# Patient Record
Sex: Female | Born: 1975 | Race: White | Hispanic: No | Marital: Married | Smoking: Never smoker
Health system: Southern US, Community
[De-identification: ages and names within clinical notes are randomized; demographics above are authoritative.]

## PROBLEM LIST (undated history)

## (undated) ENCOUNTER — Inpatient Hospital Stay (HOSPITAL_COMMUNITY): Payer: Self-pay

## (undated) DIAGNOSIS — Z9889 Other specified postprocedural states: Secondary | ICD-10-CM

## (undated) DIAGNOSIS — R112 Nausea with vomiting, unspecified: Secondary | ICD-10-CM

## (undated) DIAGNOSIS — O24419 Gestational diabetes mellitus in pregnancy, unspecified control: Secondary | ICD-10-CM

## (undated) DIAGNOSIS — Z789 Other specified health status: Secondary | ICD-10-CM

## (undated) DIAGNOSIS — Z923 Personal history of irradiation: Secondary | ICD-10-CM

## (undated) DIAGNOSIS — C801 Malignant (primary) neoplasm, unspecified: Secondary | ICD-10-CM

## (undated) HISTORY — PX: DIAGNOSTIC LAPAROSCOPY: SUR761

## (undated) HISTORY — PX: ABDOMINAL HYSTERECTOMY: SHX81

## (undated) HISTORY — PX: OTHER SURGICAL HISTORY: SHX169

---

## 2004-03-17 ENCOUNTER — Emergency Department (HOSPITAL_COMMUNITY): Admission: EM | Admit: 2004-03-17 | Discharge: 2004-03-17 | Payer: Self-pay | Admitting: Emergency Medicine

## 2004-03-18 ENCOUNTER — Ambulatory Visit (HOSPITAL_COMMUNITY): Admission: RE | Admit: 2004-03-18 | Discharge: 2004-03-18 | Payer: Self-pay | Admitting: Emergency Medicine

## 2004-08-15 ENCOUNTER — Ambulatory Visit: Payer: Self-pay | Admitting: Internal Medicine

## 2004-08-19 ENCOUNTER — Ambulatory Visit: Payer: Self-pay | Admitting: Internal Medicine

## 2007-07-12 ENCOUNTER — Encounter: Admission: RE | Admit: 2007-07-12 | Discharge: 2007-07-12 | Payer: Self-pay | Admitting: Family Medicine

## 2007-07-30 ENCOUNTER — Ambulatory Visit (HOSPITAL_COMMUNITY): Admission: RE | Admit: 2007-07-30 | Discharge: 2007-07-30 | Payer: Self-pay | Admitting: Obstetrics and Gynecology

## 2008-07-05 ENCOUNTER — Inpatient Hospital Stay (HOSPITAL_COMMUNITY): Admission: AD | Admit: 2008-07-05 | Discharge: 2008-07-06 | Payer: Self-pay | Admitting: Obstetrics and Gynecology

## 2008-08-24 ENCOUNTER — Inpatient Hospital Stay (HOSPITAL_COMMUNITY): Admission: AD | Admit: 2008-08-24 | Discharge: 2008-08-24 | Payer: Self-pay | Admitting: Obstetrics and Gynecology

## 2008-09-11 ENCOUNTER — Inpatient Hospital Stay (HOSPITAL_COMMUNITY): Admission: AD | Admit: 2008-09-11 | Discharge: 2008-09-14 | Payer: Self-pay | Admitting: Obstetrics & Gynecology

## 2010-04-06 ENCOUNTER — Encounter
Admission: RE | Admit: 2010-04-06 | Discharge: 2010-04-06 | Payer: Self-pay | Source: Home / Self Care | Attending: Obstetrics and Gynecology | Admitting: Obstetrics and Gynecology

## 2010-05-03 ENCOUNTER — Inpatient Hospital Stay (HOSPITAL_COMMUNITY)
Admission: AD | Admit: 2010-05-03 | Discharge: 2010-05-03 | Payer: Self-pay | Source: Home / Self Care | Attending: Obstetrics and Gynecology | Admitting: Obstetrics and Gynecology

## 2010-05-12 ENCOUNTER — Inpatient Hospital Stay (HOSPITAL_COMMUNITY)
Admission: AD | Admit: 2010-05-12 | Discharge: 2010-05-14 | Payer: Self-pay | Source: Home / Self Care | Attending: Obstetrics and Gynecology | Admitting: Obstetrics and Gynecology

## 2010-07-25 LAB — CBC
HCT: 35.6 % — ABNORMAL LOW (ref 36.0–46.0)
HCT: 36.2 % (ref 36.0–46.0)
HCT: 37 % (ref 36.0–46.0)
Hemoglobin: 12.3 g/dL (ref 12.0–15.0)
Hemoglobin: 12.4 g/dL (ref 12.0–15.0)
Hemoglobin: 13.8 g/dL (ref 12.0–15.0)
MCH: 29.8 pg (ref 26.0–34.0)
MCHC: 34.3 g/dL (ref 30.0–36.0)
MCHC: 34.6 g/dL (ref 30.0–36.0)
MCHC: 35.4 g/dL (ref 30.0–36.0)
Platelets: 187 10*3/uL (ref 150–400)
Platelets: 191 10*3/uL (ref 150–400)
Platelets: 215 10*3/uL (ref 150–400)
RBC: 4.13 MIL/uL (ref 3.87–5.11)
RDW: 13.3 % (ref 11.5–15.5)
RDW: 13.5 % (ref 11.5–15.5)
RDW: 13.6 % (ref 11.5–15.5)
WBC: 9.8 10*3/uL (ref 4.0–10.5)

## 2010-07-25 LAB — RPR: RPR Ser Ql: NONREACTIVE

## 2010-08-23 LAB — CBC: Platelets: 192 10*3/uL (ref 150–400)

## 2010-08-24 LAB — CBC
HCT: 37.9 % (ref 36.0–46.0)
MCHC: 35.7 g/dL (ref 30.0–36.0)
MCV: 89.7 fL (ref 78.0–100.0)
RBC: 4.22 MIL/uL (ref 3.87–5.11)
WBC: 11.1 10*3/uL — ABNORMAL HIGH (ref 4.0–10.5)

## 2010-08-30 LAB — URINE CULTURE: Colony Count: >=100000

## 2010-08-30 LAB — URINALYSIS, ROUTINE W REFLEX MICROSCOPIC
Ketones, ur: 40 mg/dL — AB
Protein, ur: NEGATIVE mg/dL
Specific Gravity, Urine: 1.025 (ref 1.005–1.030)
pH: 6 (ref 5.0–8.0)

## 2010-08-30 LAB — URINE MICROSCOPIC-ADD ON

## 2010-12-15 LAB — STREP B DNA PROBE: GBS: POSITIVE

## 2010-12-15 LAB — ANTIBODY SCREEN: Antibody Screen: NEGATIVE

## 2010-12-15 LAB — GC/CHLAMYDIA PROBE AMP, GENITAL
Chlamydia: NEGATIVE
Gonorrhea: NEGATIVE

## 2010-12-15 LAB — ABO/RH: RH Type: POSITIVE

## 2011-05-16 NOTE — L&D Delivery Note (Signed)
Delivery Note At 2:46 AM a viable female was delivered via Vaginal, Spontaneous Delivery (Presentation: ;  ).  APGAR:8/9 , ; weight .   Placenta status:intact , .  Cord: 3 vessels with the following complications: .    Anesthesia: Epidural  Episiotomy: None Lacerations: none Suture Repair: na Est. Blood Loss (mL): 400 cc  Mom to postpartum.  Baby to nursery-stable.  Victoria Cunningham 06/25/2011, 2:53 AM

## 2011-05-20 ENCOUNTER — Encounter (HOSPITAL_COMMUNITY): Payer: Self-pay | Admitting: *Deleted

## 2011-05-20 ENCOUNTER — Inpatient Hospital Stay (HOSPITAL_COMMUNITY)
Admission: AD | Admit: 2011-05-20 | Discharge: 2011-05-20 | Disposition: A | Discharge status: Home / Self Care | Payer: 59 | Source: Ambulatory Visit | Attending: Obstetrics and Gynecology | Admitting: Obstetrics and Gynecology

## 2011-05-20 DIAGNOSIS — M545 Low back pain, unspecified: Secondary | ICD-10-CM | POA: Insufficient documentation

## 2011-05-20 DIAGNOSIS — O479 False labor, unspecified: Secondary | ICD-10-CM

## 2011-05-20 DIAGNOSIS — O99891 Other specified diseases and conditions complicating pregnancy: Secondary | ICD-10-CM | POA: Insufficient documentation

## 2011-05-20 DIAGNOSIS — Z01419 Encounter for gynecological examination (general) (routine) without abnormal findings: Secondary | ICD-10-CM

## 2011-05-20 HISTORY — DX: Other specified health status: Z78.9

## 2011-05-20 LAB — URINALYSIS, ROUTINE W REFLEX MICROSCOPIC
Bilirubin Urine: NEGATIVE
Leukocytes, UA: NEGATIVE
Nitrite: NEGATIVE
Specific Gravity, Urine: >1.03 — ABNORMAL HIGH (ref 1.005–1.030)

## 2011-05-20 MED ORDER — ONDANSETRON 4 MG PO TBDP: 4.0000 mg | ORAL_TABLET | Freq: Four times a day (QID) | ORAL | Status: AC | PRN

## 2011-05-20 NOTE — ED Provider Notes (Signed)
Chief Complaint:  Emesis and Rupture of Membranes   Victoria Cunningham is  36 y.o. G3P2002 at [redacted]w[redacted]d presents complaining of Emesis and Rupture of Membranes .  She states she is not having contractions associated with none vaginal bleeding, possibly ruptured membranes, along with active fetal movement. She had fluid come out with coughing earlier today  Obstetrical/Gynecological History: Menstrual History: OB History    Grav Para Term Preterm Abortions TAB SAB Ect Mult Living   3 2 2  0 0 0 0 0 0 2      [place] No LMP recorded. Patient is pregnant.     Past Medical History: Past Medical History  Diagnosis Date  . No pertinent past medical history     Past Surgical History: Past Surgical History  Procedure Date  . Lapriscopic     Family History: No family history on file.  Social History: History  Substance Use Topics  . Smoking status: Never Smoker   . Smokeless tobacco: Not on file  . Alcohol Use: No    Allergies: No Known Allergies  Meds:  Prescriptions prior to admission  Medication Sig Dispense Refill  . calcium carbonate (TUMS - DOSED IN MG ELEMENTAL CALCIUM) 500 MG chewable tablet Chew 3-4 tablets by mouth daily. For indigestion       . chlorpheniramine (CHLOR-TRIMETON) 4 MG tablet Take 4 mg by mouth every 4 (four) hours.        . Prenatal Vit-Fe Fumarate-FA (PRENATAL MULTIVITAMIN) TABS Take 1 tablet by mouth daily.          Review of Systems - Please refer to the aforementioned patients' reports.     Physical Exam  Blood pressure 125/66, pulse 111, temperature 98.4 F (36.9 C), temperature source Oral, resp. rate 18, height 5\' 3"  (1.6 m), weight 94.802 kg (209 lb). GENERAL: Well-developed, well-nourished female in no acute distress.  LUNGS: Clear to auscultation bilaterally.  HEART: Regular rate and rhythm. ABDOMEN: Soft, nontender, nondistended, gravid.  EXTREMITIES: Nontender, no edema, 2+ distal pulses. Cervical Exam: Dilatation 0cm   Effacement 0%    Station -3   Presentation: cephalic SSE:  No pooling, negative fern and valsalva, normal appearing discharge FHT:  Baseline rate 140 bpm   Variability moderate  Accelerations present   Decelerations none Contractions: Every 0 mins   Labs: Recent Results (from the past 24 hour(s))  URINALYSIS, ROUTINE W REFLEX MICROSCOPIC   Collection Time   05/20/11  5:20 PM      Component Value Range   Color, Urine YELLOW  YELLOW    APPearance CLEAR  CLEAR    Specific Gravity, Urine >1.030 (*) 1.005 - 1.030    pH 6.0  5.0 - 8.0    Glucose, UA NEGATIVE  NEGATIVE (mg/dL)   Hgb urine dipstick NEGATIVE  NEGATIVE    Bilirubin Urine NEGATIVE  NEGATIVE    Ketones, ur 15 (*) NEGATIVE (mg/dL)   Protein, ur NEGATIVE  NEGATIVE (mg/dL)   Urobilinogen, UA 1.0  0.0 - 1.0 (mg/dL)   Nitrite NEGATIVE  NEGATIVE    Leukocytes, UA NEGATIVE  NEGATIVE    Imaging Studies:  No results found.  Assessment: Victoria Cunningham is  36 y.o. G3P2002 at [redacted]w[redacted]d presents with probable urinary incontinence   Plan: Dr. Rana Snare notified; Rx for Zofran  CRESENZO-DISHMAN,Zigmund Linse 1/5/20136:06 PM

## 2011-05-20 NOTE — Progress Notes (Signed)
Pt reports cough this morning and a large amount of liquid came out. Pt reports having vomiting x 5 today.

## 2011-06-24 ENCOUNTER — Encounter (HOSPITAL_COMMUNITY): Payer: Self-pay

## 2011-06-24 ENCOUNTER — Inpatient Hospital Stay (HOSPITAL_COMMUNITY)
Admission: AD | Admit: 2011-06-24 | Discharge: 2011-06-26 | DRG: 775 | Disposition: A | Discharge status: Home / Self Care | Payer: 59 | Source: Ambulatory Visit | Attending: Obstetrics and Gynecology | Admitting: Obstetrics and Gynecology

## 2011-06-24 ENCOUNTER — Encounter (HOSPITAL_COMMUNITY): Payer: Self-pay | Admitting: Anesthesiology

## 2011-06-24 ENCOUNTER — Inpatient Hospital Stay (HOSPITAL_COMMUNITY): Payer: 59 | Admitting: Anesthesiology

## 2011-06-24 DIAGNOSIS — O09529 Supervision of elderly multigravida, unspecified trimester: Principal | ICD-10-CM | POA: Diagnosis present

## 2011-06-24 DIAGNOSIS — Z22330 Carrier of Group B streptococcus: Secondary | ICD-10-CM

## 2011-06-24 DIAGNOSIS — O99892 Other specified diseases and conditions complicating childbirth: Secondary | ICD-10-CM | POA: Diagnosis present

## 2011-06-24 HISTORY — DX: Other specified postprocedural states: Z98.890

## 2011-06-24 HISTORY — DX: Other specified postprocedural states: R11.2

## 2011-06-24 LAB — CBC
HCT: 35.6 % — ABNORMAL LOW (ref 36.0–46.0)
Hemoglobin: 12.2 g/dL (ref 12.0–15.0)
MCHC: 34.3 g/dL (ref 30.0–36.0)
MCV: 87.3 fL (ref 78.0–100.0)
RDW: 13.2 % (ref 11.5–15.5)

## 2011-06-24 MED ORDER — OXYCODONE-ACETAMINOPHEN 5-325 MG PO TABS: 1.0000 | ORAL_TABLET | ORAL | Status: DC | PRN

## 2011-06-24 MED ORDER — PENICILLIN G POTASSIUM 5000000 UNITS IJ SOLR
5.0000 10*6.[IU] | Freq: Once | INTRAVENOUS | Status: AC
  Administered 2011-06-24: 5 10*6.[IU] via INTRAVENOUS
  Filled 2011-06-24: qty 5

## 2011-06-24 MED ORDER — LACTATED RINGERS IV SOLN
500.0000 mL | Freq: Once | INTRAVENOUS | Status: AC
  Administered 2011-06-24: 500 mL via INTRAVENOUS

## 2011-06-24 MED ORDER — PENICILLIN G POTASSIUM 5000000 UNITS IJ SOLR
2.5000 10*6.[IU] | INTRAVENOUS | Status: DC
  Administered 2011-06-25: 2.5 10*6.[IU] via INTRAVENOUS
  Filled 2011-06-24 (×4): qty 2.5

## 2011-06-24 MED ORDER — IBUPROFEN 600 MG PO TABS: 600.0000 mg | ORAL_TABLET | Freq: Four times a day (QID) | ORAL | Status: DC | PRN

## 2011-06-24 MED ORDER — PHENYLEPHRINE 40 MCG/ML (10ML) SYRINGE FOR IV PUSH (FOR BLOOD PRESSURE SUPPORT): 80.0000 ug | PREFILLED_SYRINGE | INTRAVENOUS | Status: DC | PRN

## 2011-06-24 MED ORDER — EPHEDRINE 5 MG/ML INJ
10.0000 mg | INTRAVENOUS | Status: DC | PRN
  Filled 2011-06-24: qty 4

## 2011-06-24 MED ORDER — FLEET ENEMA 7-19 GM/118ML RE ENEM: 1.0000 | ENEMA | RECTAL | Status: DC | PRN

## 2011-06-24 MED ORDER — ONDANSETRON HCL 4 MG/2ML IJ SOLN
4.0000 mg | Freq: Four times a day (QID) | INTRAMUSCULAR | Status: DC | PRN
  Administered 2011-06-25: 4 mg via INTRAVENOUS
  Filled 2011-06-24: qty 2

## 2011-06-24 MED ORDER — CITRIC ACID-SODIUM CITRATE 334-500 MG/5ML PO SOLN
30.0000 mL | ORAL | Status: DC | PRN
  Administered 2011-06-24: 30 mL via ORAL
  Filled 2011-06-24 (×2): qty 15

## 2011-06-24 MED ORDER — ACETAMINOPHEN 325 MG PO TABS: 650.0000 mg | ORAL_TABLET | ORAL | Status: DC | PRN

## 2011-06-24 MED ORDER — OXYTOCIN BOLUS FROM INFUSION
500.0000 mL | Freq: Once | INTRAVENOUS | Status: DC
  Filled 2011-06-24: qty 1000
  Filled 2011-06-24: qty 500

## 2011-06-24 MED ORDER — DIPHENHYDRAMINE HCL 50 MG/ML IJ SOLN: 12.5000 mg | INTRAMUSCULAR | Status: DC | PRN

## 2011-06-24 MED ORDER — OXYTOCIN 20 UNITS IN LACTATED RINGERS INFUSION - SIMPLE
125.0000 mL/h | Freq: Once | INTRAVENOUS | Status: AC
  Administered 2011-06-25: 125 mL/h via INTRAVENOUS

## 2011-06-24 MED ORDER — EPHEDRINE 5 MG/ML INJ: 10.0000 mg | INTRAVENOUS | Status: DC | PRN

## 2011-06-24 MED ORDER — LACTATED RINGERS IV SOLN: INTRAVENOUS | Status: DC

## 2011-06-24 MED ORDER — PHENYLEPHRINE 40 MCG/ML (10ML) SYRINGE FOR IV PUSH (FOR BLOOD PRESSURE SUPPORT)
80.0000 ug | PREFILLED_SYRINGE | INTRAVENOUS | Status: DC | PRN
  Filled 2011-06-24: qty 5

## 2011-06-24 MED ORDER — LIDOCAINE HCL (PF) 1 % IJ SOLN
INTRAMUSCULAR | Status: DC | PRN
  Administered 2011-06-24 (×3): 4 mL

## 2011-06-24 MED ORDER — FENTANYL 2.5 MCG/ML BUPIVACAINE 1/10 % EPIDURAL INFUSION (WH - ANES)
14.0000 mL/h | INTRAMUSCULAR | Status: DC
  Administered 2011-06-24 – 2011-06-25 (×2): 14 mL/h via EPIDURAL
  Filled 2011-06-24 (×2): qty 60

## 2011-06-24 MED ORDER — LACTATED RINGERS IV SOLN: 500.0000 mL | INTRAVENOUS | Status: DC | PRN

## 2011-06-24 MED ORDER — LIDOCAINE HCL (PF) 1 % IJ SOLN
30.0000 mL | INTRAMUSCULAR | Status: DC | PRN
  Filled 2011-06-24: qty 30

## 2011-06-24 NOTE — Progress Notes (Signed)
Pt states ctx's since 12pm this afternoon, denies lof, notes bloody show. +Fm, was 4cm in office.

## 2011-06-24 NOTE — ED Notes (Signed)
Dr. Arelia Sneddon notified pt's cervical exam post ambulation, now 5.5cm, can stretch to 6, still 80%, cervix more anterior, membranes bulging. Ctx's q1-4.5. GBS positive, orders to admit to [facility], PCN protocol.

## 2011-06-24 NOTE — ED Notes (Signed)
Dr. Arelia Sneddon notified of pt in MAU for labor eval, G3P2, efm tracing reactive, cervix was 4cm, now 5, no change in effacement, ctx's q3-5 minutes apart, order to po hydrate and ambulate x1hour, then recheck and call MD with results.

## 2011-06-24 NOTE — Anesthesia Procedure Notes (Signed)
Epidural Patient location during procedure: OB Start time: 06/24/2011 10:54 PM Reason for block: procedure for pain  Staffing Performed by: anesthesiologist   Preanesthetic Checklist Completed: patient identified, site marked, surgical consent, pre-op evaluation, timeout performed, IV checked, risks and benefits discussed and monitors and equipment checked  Epidural Patient position: sitting Prep: site prepped and draped and DuraPrep Patient monitoring: continuous pulse ox and blood pressure Approach: midline Injection technique: LOR air  Needle:  Needle type: Tuohy  Needle gauge: 17 G Needle length: 9 cm Catheter type: closed end flexible Catheter size: 19 Gauge Test dose: negative  Assessment Events: blood not aspirated, injection not painful, no injection resistance, negative IV test and no paresthesia  Additional Notes Discussed risk of headache, infection, bleeding, nerve injury and failed or incomplete block.  Patient voices understanding and wishes to proceed.

## 2011-06-24 NOTE — Anesthesia Preprocedure Evaluation (Signed)
Anesthesia Evaluation  Patient identified by MRN, date of birth, ID band Patient awake    Reviewed: Allergy & Precautions, H&P , NPO status , Patient's Chart, lab work & pertinent test results, reviewed documented beta blocker date and time   History of Anesthesia Complications (+) PONV  Airway Mallampati: II TM Distance: >3 FB Neck ROM: full    Dental  (+) Teeth Intact   Pulmonary neg pulmonary ROS,  clear to auscultation        Cardiovascular neg cardio ROS regular Normal    Neuro/Psych Negative Neurological ROS  Negative Psych ROS   GI/Hepatic Neg liver ROS, GERD- (with pregnancy)  Medicated,  Endo/Other  Morbid obesity  Renal/GU negative Renal ROS  Genitourinary negative   Musculoskeletal   Abdominal   Peds  Hematology negative hematology ROS (+)   Anesthesia Other Findings   Reproductive/Obstetrics (+) Pregnancy                           Anesthesia Physical Anesthesia Plan  ASA: II  Anesthesia Plan: Epidural   Post-op Pain Management:    Induction:   Airway Management Planned:   Additional Equipment:   Intra-op Plan:   Post-operative Plan:   Informed Consent: I have reviewed the patients History and Physical, chart, labs and discussed the procedure including the risks, benefits and alternatives for the proposed anesthesia with the patient or authorized representative who has indicated his/her understanding and acceptance.     Plan Discussed with:   Anesthesia Plan Comments:         Anesthesia Quick Evaluation

## 2011-06-25 ENCOUNTER — Encounter (HOSPITAL_COMMUNITY): Payer: Self-pay | Admitting: *Deleted

## 2011-06-25 MED ORDER — BENZOCAINE-MENTHOL 20-0.5 % EX AERO: 1.0000 "application " | INHALATION_SPRAY | CUTANEOUS | Status: DC | PRN

## 2011-06-25 MED ORDER — FLEET ENEMA 7-19 GM/118ML RE ENEM: 1.0000 | ENEMA | Freq: Every day | RECTAL | Status: DC | PRN

## 2011-06-25 MED ORDER — SIMETHICONE 80 MG PO CHEW: 80.0000 mg | CHEWABLE_TABLET | ORAL | Status: DC | PRN

## 2011-06-25 MED ORDER — PRENATAL MULTIVITAMIN CH: 1.0000 | ORAL_TABLET | Freq: Every day | ORAL | Status: DC

## 2011-06-25 MED ORDER — SENNOSIDES-DOCUSATE SODIUM 8.6-50 MG PO TABS
2.0000 | ORAL_TABLET | Freq: Every day | ORAL | Status: DC
  Administered 2011-06-25: 2 via ORAL

## 2011-06-25 MED ORDER — ONDANSETRON HCL 4 MG/2ML IJ SOLN: 4.0000 mg | INTRAMUSCULAR | Status: DC | PRN

## 2011-06-25 MED ORDER — LANOLIN HYDROUS EX OINT: TOPICAL_OINTMENT | CUTANEOUS | Status: DC | PRN

## 2011-06-25 MED ORDER — DIBUCAINE 1 % RE OINT: 1.0000 "application " | TOPICAL_OINTMENT | RECTAL | Status: DC | PRN

## 2011-06-25 MED ORDER — WITCH HAZEL-GLYCERIN EX PADS: 1.0000 "application " | MEDICATED_PAD | CUTANEOUS | Status: DC | PRN

## 2011-06-25 MED ORDER — ZOLPIDEM TARTRATE 5 MG PO TABS: 5.0000 mg | ORAL_TABLET | Freq: Every evening | ORAL | Status: DC | PRN

## 2011-06-25 MED ORDER — DIPHENHYDRAMINE HCL 25 MG PO CAPS: 25.0000 mg | ORAL_CAPSULE | Freq: Four times a day (QID) | ORAL | Status: DC | PRN

## 2011-06-25 MED ORDER — BISACODYL 10 MG RE SUPP: 10.0000 mg | Freq: Every day | RECTAL | Status: DC | PRN

## 2011-06-25 MED ORDER — OXYCODONE-ACETAMINOPHEN 5-325 MG PO TABS
1.0000 | ORAL_TABLET | ORAL | Status: DC | PRN
  Administered 2011-06-25: 2 via ORAL
  Administered 2011-06-26: 1 via ORAL
  Filled 2011-06-25: qty 1
  Filled 2011-06-25: qty 2

## 2011-06-25 MED ORDER — ONDANSETRON HCL 4 MG PO TABS: 4.0000 mg | ORAL_TABLET | ORAL | Status: DC | PRN

## 2011-06-25 MED ORDER — PRENATAL MULTIVITAMIN CH
1.0000 | ORAL_TABLET | Freq: Every day | ORAL | Status: DC
  Administered 2011-06-25: 1 via ORAL
  Filled 2011-06-25: qty 1

## 2011-06-25 MED ORDER — TETANUS-DIPHTH-ACELL PERTUSSIS 5-2.5-18.5 LF-MCG/0.5 IM SUSP: 0.5000 mL | Freq: Once | INTRAMUSCULAR | Status: DC

## 2011-06-25 MED ORDER — IBUPROFEN 600 MG PO TABS
600.0000 mg | ORAL_TABLET | Freq: Four times a day (QID) | ORAL | Status: DC
  Administered 2011-06-25 – 2011-06-26 (×5): 600 mg via ORAL
  Filled 2011-06-25 (×6): qty 1

## 2011-06-25 NOTE — Progress Notes (Signed)
Post Partum Day zero Subjective: no complaints  Objective: Blood pressure 104/68, pulse 76, temperature 97.4 F (36.3 C), temperature source Oral, resp. rate 20, height 5\' 3"  (1.6 m), weight 96.843 kg (213 lb 8 oz), SpO2 98.00%, unknown if currently breastfeeding.  Physical Exam:  General: alert Lochia: appropriate Uterine Fundus: firm Incision: none DVT Evaluation: No evidence of DVT seen on physical exam.   Basename 06/24/11 2201  HGB 12.2  HCT 35.6*    Assessment/Plan: Plan for discharge tomorrow   LOS: 1 day   Tayelor Osborne S 06/25/2011, 10:23 AM

## 2011-06-25 NOTE — Progress Notes (Signed)
Transferred via wheelchair, infant and husband with pt

## 2011-06-25 NOTE — Anesthesia Postprocedure Evaluation (Signed)
  Anesthesia Post-op Note  Patient: Victoria Cunningham  Procedure(s) Performed: * No procedures listed *  Patient Location: Mother/Baby  Anesthesia Type: Epidural  Level of Consciousness: awake, alert  and oriented  Airway and Oxygen Therapy: Patient Spontanous Breathing  Post-op Pain: mild  Post-op Assessment: Patient's Cardiovascular Status Stable, Respiratory Function Stable, Patent Airway, No signs of Nausea or vomiting and Pain level controlled  Post-op Vital Signs: stable  Complications: No apparent anesthesia complications

## 2011-06-25 NOTE — H&P (Signed)
Victoria Cunningham is a 36 y.o. female presenting at 87.2 with spontaneious onset of labor. Post GBS Maternal Medical History:  Contractions: Frequency: regular.   Perceived severity is moderate.    Fetal activity: Perceived fetal activity is normal.    Prenatal Complications - Diabetes: none.    OB History    Grav Para Term Preterm Abortions TAB SAB Ect Mult Living   3 2 2  0 0 0 0 0 0 2     Past Medical History  Diagnosis Date  . No pertinent past medical history   . PONV (postoperative nausea and vomiting)    Past Surgical History  Procedure Date  . Lapriscopic    Family History: family history includes Cancer in her maternal grandfather and paternal grandfather; Diabetes in her father; and Heart disease in her father.  There is no history of Anesthesia problems. Social History:  reports that she has never smoked. She has never used smokeless tobacco. She reports that she does not drink alcohol or use illicit drugs.  ROS  Dilation: 6 Effacement (%): 80 Station: -1 Exam by:: SBeck, RN/POkehie, RN Blood pressure 125/78, pulse 84, temperature 97.1 F (36.2 C), temperature source Oral, resp. rate 20, height 5\' 3"  (1.6 m), weight 96.843 kg (213 lb 8 oz), SpO2 99.00%, unknown if currently breastfeeding. Maternal Exam:  Uterine Assessment: Contraction strength is moderate.  Contraction frequency is regular.   Abdomen: Patient reports no abdominal tenderness. Fundal height is c/w dates.   Estimated fetal weight is 7.   Fetal presentation: vertex  Introitus: Amniotic fluid character: clear.  Cervix: Cervix evaluated by digital exam.     Physical Exam  Prenatal labs: ABO, Rh: AB/Positive/-- (08/02 0000) Antibody: Negative (08/02 0000) Rubella: Immune (08/02 0000) RPR: Nonreactive (08/02 0000)  HBsAg: Negative (08/02 0000)  HIV: Non-reactive (08/02 0000)  GBS: Positive (08/02 0000)   Assessment/Plan: IUP AT 38.2 WEEKS WITH SPONTANEOUS ONSET OF LABOR POST  gbs ANTIBIOTICS AND ROUTINE L AND D  Victoria Cunningham S 06/25/2011, 12:01 AM

## 2011-06-26 LAB — CBC
Hemoglobin: 12.6 g/dL (ref 12.0–15.0)
RBC: 4.25 MIL/uL (ref 3.87–5.11)
WBC: 7.8 10*3/uL (ref 4.0–10.5)

## 2011-06-26 NOTE — Discharge Summary (Signed)
Obstetric Discharge Summary Reason for Admission: onset of labor Prenatal Procedures: none Intrapartum Procedures: spontaneous vaginal delivery Postpartum Procedures: none Complications-Operative and Postpartum: none Hemoglobin  Date Value Range Status  06/26/2011 12.6  12.0-15.0 (g/dL) Final     HCT  Date Value Range Status  06/26/2011 38.0  36.0-46.0 (%) Final    Discharge Diagnoses: Term Pregnancy-delivered  Discharge Information: Date: 06/26/2011 Activity: pelvic rest Diet: routine Medications: None Condition: stable Instructions: refer to practice specific booklet Discharge to: home   Newborn Data: Live born female  Birth Weight: 6 lb 13.5 oz (3104 g) APGAR: 8, 9  Home with mother.  Victoria Cunningham L 06/26/2011, 7:29 AM

## 2011-06-26 NOTE — Progress Notes (Signed)
Post Partum Day 1  Subjective: no complaints, up ad lib, voiding, tolerating PO and + flatus Wants to go home  Objective: Blood pressure 94/60, pulse 66, temperature 97.9 F (36.6 C), temperature source Oral, resp. rate 18, height 5\' 3"  (1.6 m), weight 96.843 kg (213 lb 8 oz), SpO2 98.00%, unknown if currently breastfeeding.  Physical Exam:  General: alert, cooperative and appears stated age Lochia: appropriate Uterine Fundus: firm Incision: not applicable DVT Evaluation: No evidence of DVT seen on physical exam.   Basename 06/26/11 0500 06/24/11 2201  HGB 12.6 12.2  HCT 38.0 35.6*    Assessment/Plan: Discharge home   LOS: 2 days   Braley Luckenbaugh L 06/26/2011, 7:28 AM

## 2014-01-30 LAB — OB RESULTS CONSOLE GC/CHLAMYDIA
Chlamydia: NEGATIVE
GC PROBE AMP, GENITAL: NEGATIVE

## 2014-01-30 LAB — OB RESULTS CONSOLE HEPATITIS B SURFACE ANTIGEN: HEP B S AG: NEGATIVE

## 2014-01-30 LAB — OB RESULTS CONSOLE ABO/RH: RH TYPE: POSITIVE

## 2014-01-30 LAB — OB RESULTS CONSOLE HIV ANTIBODY (ROUTINE TESTING): HIV: NONREACTIVE

## 2014-01-30 LAB — OB RESULTS CONSOLE RPR: RPR: NONREACTIVE

## 2014-01-30 LAB — OB RESULTS CONSOLE RUBELLA ANTIBODY, IGM: Rubella: IMMUNE

## 2014-01-30 LAB — OB RESULTS CONSOLE ANTIBODY SCREEN: ANTIBODY SCREEN: NEGATIVE

## 2014-02-12 ENCOUNTER — Other Ambulatory Visit: Payer: Self-pay | Admitting: Obstetrics and Gynecology

## 2014-02-13 LAB — CYTOLOGY - PAP

## 2014-03-16 ENCOUNTER — Encounter (HOSPITAL_COMMUNITY): Payer: Self-pay | Admitting: *Deleted

## 2014-05-15 NOTE — L&D Delivery Note (Signed)
Delivery Note At 4:05 PM a viable female was delivered via  (Presentation: ;  ).  APGAR: , ; weight  .   Placenta status:spont/intact , .  Cord:loose nuchal X 2    with the following complications: .  Cord pH: not sent  Anesthesia: Epidural  Episiotomy:  none Lacerations:  1st deg Suture Repair: 3.0 chromic Est. Blood Loss (mL):  300  Mom to postpartum.  Baby to Couplet care / Skin to Skin.  Meriel PicaHOLLAND,Taneesha Edgin M 08/15/2014, 4:14 PM

## 2014-06-24 ENCOUNTER — Inpatient Hospital Stay (HOSPITAL_COMMUNITY)
Admission: AD | Admit: 2014-06-24 | Discharge: 2014-06-24 | Disposition: A | Discharge status: Home / Self Care | Payer: 59 | Source: Ambulatory Visit | Attending: Obstetrics and Gynecology | Admitting: Obstetrics and Gynecology

## 2014-06-24 ENCOUNTER — Encounter (HOSPITAL_COMMUNITY): Payer: Self-pay | Admitting: *Deleted

## 2014-06-24 ENCOUNTER — Inpatient Hospital Stay (HOSPITAL_COMMUNITY): Payer: 59

## 2014-06-24 DIAGNOSIS — Z3A32 32 weeks gestation of pregnancy: Secondary | ICD-10-CM | POA: Insufficient documentation

## 2014-06-24 DIAGNOSIS — O4693 Antepartum hemorrhage, unspecified, third trimester: Secondary | ICD-10-CM | POA: Diagnosis present

## 2014-06-24 DIAGNOSIS — O4690 Antepartum hemorrhage, unspecified, unspecified trimester: Secondary | ICD-10-CM | POA: Insufficient documentation

## 2014-06-24 NOTE — MAU Note (Signed)
Pt states she started having vaginal bleeding 1wk a day after intercourse. Pt states she noticed again today,pt describes discharge as very light pink tinged

## 2014-06-24 NOTE — Discharge Instructions (Signed)
°  Vaginal Bleeding During Pregnancy, Third Trimester °A small amount of bleeding (spotting) from the vagina is relatively common in pregnancy. Various things can cause bleeding or spotting in pregnancy. Sometimes the bleeding is normal and is not a problem. However, bleeding during the third trimester can also be a sign of something serious for the mother and the baby. Be sure to tell your health care provider about any vaginal bleeding right away.  °Some possible causes of vaginal bleeding during the third trimester include:  °· The placenta may be partially or completely covering the opening to the cervix (placenta previa).   °· The placenta may have separated from the uterus (abruption of the placenta).   °· There may be an infection or growth on the cervix.   °· You may be starting labor, called discharging of the mucus plug.   °· The placenta may grow into the muscle layer of the uterus (placenta accreta).   °HOME CARE INSTRUCTIONS  °Watch your condition for any changes. The following actions may help to lessen any discomfort you are feeling:  °· Follow your health care provider's instructions for limiting your activity. If your health care provider orders bed rest, you may need to stay in bed and only get up to use the bathroom. However, your health care provider may allow you to continue light activity. °· If needed, make plans for someone to help with your regular activities and responsibilities while you are on bed rest. °· Keep track of the number of pads you use each day, how often you change pads, and how soaked (saturated) they are. Write this down. °· Do not use tampons. Do not douche. °· Do not have sexual intercourse or orgasms until approved by your health care provider. °· Follow your health care provider's advice about lifting, driving, and physical activities. °· If you pass any tissue from your vagina, save the tissue so you can show it to your health care provider.   °· Only take  over-the-counter or prescription medicines as directed by your health care provider. °· Do not take aspirin because it can make you bleed.   °· Keep all follow-up appointments as directed by your health care provider. °SEEK MEDICAL CARE IF: °· You have any vaginal bleeding during any part of your pregnancy. °· You have cramps or labor pains. °· You have a fever, not controlled by medicine. °SEEK IMMEDIATE MEDICAL CARE IF:  °· You have severe cramps or pain in your back or belly (abdomen). °· You have chills. °· You have a gush of fluid from the vagina. °· You pass large clots or tissue from your vagina. °· Your bleeding increases. °· You feel light-headed or weak. °· You pass out. °· You feel less movement or no movement of the baby.   °MAKE SURE YOU: °· Understand these instructions. °· Will watch your condition. °· Will get help right away if you are not doing well or get worse. °Document Released: 07/22/2002 Document Revised: 05/06/2013 Document Reviewed: 01/06/2013 °[employer]® Patient Information ©2015 [employer]. This information is not intended to replace advice given to you by your health care provider. Make sure you discuss any questions you have with your health care provider. ° ° °

## 2014-06-24 NOTE — MAU Note (Signed)
Pt. Urine sent to lab  

## 2014-06-24 NOTE — MAU Provider Note (Signed)
CSN: [id number]     Arrival date & time 06/24/14  1658 History   None    Chief Complaint  Patient presents with  . Vaginal Bleeding     (Consider location/radiation/quality/duration/timing/severity/associated sxs/prior Treatment) HPI Victoria Cunningham is a 39 y.o. [phone] @ 6452w1d who presents to the MAU for ultrasound after being evaluated in the office for vaginal bleeding. Patient states that she went for her regular prenatal visit today and when she told them about the 2 episodes of bleeding and the cramping today they wanted the ultrasound. She denies any other problems.   Past Medical History  Diagnosis Date  . No pertinent past medical history   . PONV (postoperative nausea and vomiting)    Past Surgical History  Procedure Laterality Date  . Lapriscopic     Family History  Problem Relation Age of Onset  . Anesthesia problems Neg Hx   . Heart disease Father   . Diabetes Father   . Cancer Maternal Grandfather   . Cancer Paternal Grandfather    History  Substance Use Topics  . Smoking status: Never Smoker   . Smokeless tobacco: Never Used  . Alcohol Use: No   OB History    Gravida Para Term Preterm AB TAB SAB Ectopic Multiple Living   4 3 3  0 0 0 0 0 0 3     Review of Systems Negative except as stated in HPI   Allergies  Review of patient's allergies indicates no known allergies.  Home Medications   Prior to Admission medications   Medication Sig Start Date End Date Taking? Authorizing Provider  calcium carbonate (TUMS - DOSED IN MG ELEMENTAL CALCIUM) 500 MG chewable tablet Chew 3-6 tablets by mouth 2 (two) times daily as needed for indigestion or heartburn.   Yes Historical Provider, MD  Prenatal Vit-Fe Fumarate-FA (PRENATAL MULTIVITAMIN) TABS Take 1 tablet by mouth at bedtime.    Yes Historical Provider, MD   BP 117/77 mmHg  Pulse 101  Temp(Src) 98.1 F (36.7 C) (Oral)  Resp 16 Physical Exam  Constitutional: She is oriented to person, place, and time.  She appears well-developed and well-nourished. No distress.  HENT:  Head: Normocephalic.  Eyes: EOM are normal.  Neck: Neck supple.  Cardiovascular: Tachycardia present.   Pulmonary/Chest: Effort normal.  Abdominal: Soft. There is no tenderness.  Gravid consistent with dates  Musculoskeletal: Normal range of motion.  Neurological: She is alert and oriented to person, place, and time. No cranial nerve deficit.  Skin: Skin is warm and dry.  Psychiatric: She has a normal mood and affect. Her behavior is normal.  Nursing note and vitals reviewed.   ED Course  Procedures   Dr. Henderson Cloudomblin reviewed the ultrasound and called back and spoke with the nurse caring for the patient regarding d/c instructions.   MDM   Final diagnoses:  Vaginal bleeding in pregnancy, third trimester

## 2014-06-24 NOTE — MAU Note (Signed)
Pt sent from MD office, had bleeding today - "streak in discharge."  Has not seen any more bleeding.  Has occasional mild uc's, but did have one very painful uc today.  SVE closed today in office.

## 2014-06-25 DIAGNOSIS — Z3A32 32 weeks gestation of pregnancy: Secondary | ICD-10-CM | POA: Insufficient documentation

## 2014-06-25 DIAGNOSIS — O4690 Antepartum hemorrhage, unspecified, unspecified trimester: Secondary | ICD-10-CM | POA: Insufficient documentation

## 2014-07-21 LAB — OB RESULTS CONSOLE GBS: STREP GROUP B AG: POSITIVE

## 2014-08-08 ENCOUNTER — Inpatient Hospital Stay (HOSPITAL_COMMUNITY)
Admission: AD | Admit: 2014-08-08 | Discharge: 2014-08-08 | Disposition: A | Discharge status: Home / Self Care | Payer: 59 | Source: Ambulatory Visit | Attending: Obstetrics & Gynecology | Admitting: Obstetrics & Gynecology

## 2014-08-08 ENCOUNTER — Encounter (HOSPITAL_COMMUNITY): Payer: Self-pay

## 2014-08-08 DIAGNOSIS — O139 Gestational [pregnancy-induced] hypertension without significant proteinuria, unspecified trimester: Secondary | ICD-10-CM

## 2014-08-08 DIAGNOSIS — I10 Essential (primary) hypertension: Secondary | ICD-10-CM | POA: Diagnosis present

## 2014-08-08 DIAGNOSIS — Z3A38 38 weeks gestation of pregnancy: Secondary | ICD-10-CM | POA: Diagnosis not present

## 2014-08-08 DIAGNOSIS — O99613 Diseases of the digestive system complicating pregnancy, third trimester: Secondary | ICD-10-CM | POA: Diagnosis not present

## 2014-08-08 DIAGNOSIS — K219 Gastro-esophageal reflux disease without esophagitis: Secondary | ICD-10-CM | POA: Diagnosis not present

## 2014-08-08 DIAGNOSIS — O133 Gestational [pregnancy-induced] hypertension without significant proteinuria, third trimester: Secondary | ICD-10-CM | POA: Diagnosis not present

## 2014-08-08 HISTORY — DX: Gestational diabetes mellitus in pregnancy, unspecified control: O24.419

## 2014-08-08 LAB — URINALYSIS, ROUTINE W REFLEX MICROSCOPIC
Bilirubin Urine: NEGATIVE
GLUCOSE, UA: NEGATIVE mg/dL
Hgb urine dipstick: NEGATIVE
KETONES UR: NEGATIVE mg/dL
Leukocytes, UA: NEGATIVE
NITRITE: NEGATIVE
PROTEIN: NEGATIVE mg/dL
Specific Gravity, Urine: 1.02 (ref 1.005–1.030)
Urobilinogen, UA: 0.2 mg/dL (ref 0.0–1.0)
pH: 6 (ref 5.0–8.0)

## 2014-08-08 LAB — COMPREHENSIVE METABOLIC PANEL
ALT: 12 U/L (ref 0–35)
AST: 24 U/L (ref 0–37)
Albumin: 2.9 g/dL — ABNORMAL LOW (ref 3.5–5.2)
Alkaline Phosphatase: 147 U/L — ABNORMAL HIGH (ref 39–117)
Anion gap: 11 (ref 5–15)
BUN: 8 mg/dL (ref 6–23)
CO2: 22 mmol/L (ref 19–32)
Calcium: 9.7 mg/dL (ref 8.4–10.5)
Chloride: 105 mmol/L (ref 96–112)
Creatinine, Ser: 0.67 mg/dL (ref 0.50–1.10)
GFR calc Af Amer: >90 mL/min (ref >90)
GFR calc non Af Amer: >90 mL/min (ref >90)
Glucose, Bld: 85 mg/dL (ref 70–99)
POTASSIUM: 4.3 mmol/L (ref 3.5–5.1)
Sodium: 138 mmol/L (ref 135–145)
TOTAL PROTEIN: 6.4 g/dL (ref 6.0–8.3)
Total Bilirubin: 0.1 mg/dL — ABNORMAL LOW (ref 0.3–1.2)

## 2014-08-08 LAB — PROTEIN / CREATININE RATIO, URINE
Creatinine, Urine: 93 mg/dL
Protein Creatinine Ratio: 0.08 (ref 0.00–0.15)
Total Protein, Urine: 7 mg/dL

## 2014-08-08 LAB — CBC
HEMATOCRIT: 35.4 % — AB (ref 36.0–46.0)
HEMOGLOBIN: 12.4 g/dL (ref 12.0–15.0)
MCH: 30 pg (ref 26.0–34.0)
MCHC: 35 g/dL (ref 30.0–36.0)
MCV: 85.7 fL (ref 78.0–100.0)
Platelets: 198 10*3/uL (ref 150–400)
RBC: 4.13 MIL/uL (ref 3.87–5.11)
RDW: 13.2 % (ref 11.5–15.5)
WBC: 8.8 10*3/uL (ref 4.0–10.5)

## 2014-08-08 LAB — LACTATE DEHYDROGENASE: LDH: 147 U/L (ref 94–250)

## 2014-08-08 LAB — URIC ACID: URIC ACID, SERUM: 5.2 mg/dL (ref 2.4–7.0)

## 2014-08-08 MED ORDER — RANITIDINE HCL 150 MG PO TABS: 150.0000 mg | ORAL_TABLET | Freq: Two times a day (BID) | ORAL | Status: DC

## 2014-08-08 NOTE — MAU Provider Note (Signed)
History     CSN: [id number]  Arrival date and time: 08/08/14 4289   First Provider Initiated Contact with Patient 08/08/14 2011      Chief Complaint  Patient presents with  . Hypertension   HPI Comments: [name] is a 39 y.o. Female G4P3003 at [redacted]w[redacted]d who presents with elevated BP readings. She went to [facility] this evening and checked her BP; she has been experiencing nausea and focusing issues with her vision throughout the day. She had an elevated BP reading in the office on Tuesday and thought today that it may be elevated again. She does not have a history of high blood pressure.   Hypertension This is a new problem. The current episode started in the past 7 days. The problem is unchanged. The problem is uncontrolled. Associated symptoms include blurred vision and headaches (Dull ). There are no associated agents to hypertension. There are no known risk factors for coronary artery disease. Past treatments include nothing.    + fetal movement Denies vaginal bleeding or leaking of fluid   OB History    Gravida Para Term Preterm AB TAB SAB Ectopic Multiple Living   4 3 3  0 0 0 0 0 0 3      Past Medical History  Diagnosis Date  . No pertinent past medical history   . PONV (postoperative nausea and vomiting)   . Gestational diabetes     Past Surgical History  Procedure Laterality Date  . Lapriscopic      Family History  Problem Relation Age of Onset  . Anesthesia problems Neg Hx   . Heart disease Father   . Diabetes Father   . Cancer Maternal Grandfather   . Cancer Paternal Grandfather     History  Substance Use Topics  . Smoking status: Never Smoker   . Smokeless tobacco: Never Used  . Alcohol Use: No    Allergies: No Known Allergies  Prescriptions prior to admission  Medication Sig Dispense Refill Last Dose  . calcium carbonate (TUMS - DOSED IN MG ELEMENTAL CALCIUM) 500 MG chewable tablet Chew 3-6 tablets by mouth 2 (two) times daily as needed for  indigestion or heartburn.   08/08/2014 at Unknown time  . Prenatal Vit-Fe Fumarate-FA (PRENATAL MULTIVITAMIN) TABS Take 1 tablet by mouth at bedtime.    08/08/2014 at Unknown time   Results for orders placed or performed during the hospital encounter of 08/08/14 (from the past 48 hour(s))  Urinalysis, Routine w reflex microscopic     Status: None   Collection Time: 08/08/14  7:17 PM  Result Value Ref Range   Color, Urine YELLOW YELLOW   APPearance CLEAR CLEAR   Specific Gravity, Urine 1.020 1.005 - 1.030   pH 6.0 5.0 - 8.0   Glucose, UA NEGATIVE NEGATIVE mg/dL   Hgb urine dipstick NEGATIVE NEGATIVE   Bilirubin Urine NEGATIVE NEGATIVE   Ketones, ur NEGATIVE NEGATIVE mg/dL   Protein, ur NEGATIVE NEGATIVE mg/dL   Urobilinogen, UA 0.2 0.0 - 1.0 mg/dL   Nitrite NEGATIVE NEGATIVE   Leukocytes, UA NEGATIVE NEGATIVE    Comment: MICROSCOPIC NOT DONE ON URINES WITH NEGATIVE PROTEIN, BLOOD, LEUKOCYTES, NITRITE, OR GLUCOSE <1000 mg/dL.  Protein / creatinine ratio, urine     Status: None   Collection Time: 08/08/14  7:17 PM  Result Value Ref Range   Creatinine, Urine 93.00 mg/dL   Total Protein, Urine 7 mg/dL    Comment: NO NORMAL RANGE ESTABLISHED FOR THIS TEST   Protein Creatinine  Ratio 0.08 0.00 - 0.15  CBC     Status: Abnormal   Collection Time: 08/08/14  8:00 PM  Result Value Ref Range   WBC 8.8 4.0 - 10.5 K/uL   RBC 4.13 3.87 - 5.11 MIL/uL   Hemoglobin 12.4 12.0 - 15.0 g/dL   HCT 63.7 (L) 76.4 - 90.4 %   MCV 85.7 78.0 - 100.0 fL   MCH 30.0 26.0 - 34.0 pg   MCHC 35.0 30.0 - 36.0 g/dL   RDW 25.0 53.9 - 49.8 %   Platelets 198 150 - 400 K/uL  Comprehensive metabolic panel     Status: Abnormal   Collection Time: 08/08/14  8:00 PM  Result Value Ref Range   Sodium 138 135 - 145 mmol/L   Potassium 4.3 3.5 - 5.1 mmol/L   Chloride 105 96 - 112 mmol/L   CO2 22 19 - 32 mmol/L   Glucose, Bld 85 70 - 99 mg/dL   BUN 8 6 - 23 mg/dL   Creatinine, Ser 9.91 0.50 - 1.10 mg/dL   Calcium 9.7 8.4 -  06.8 mg/dL   Total Protein 6.4 6.0 - 8.3 g/dL   Albumin 2.9 (L) 3.5 - 5.2 g/dL   AST 24 0 - 37 U/L   ALT 12 0 - 35 U/L   Alkaline Phosphatase 147 (H) 39 - 117 U/L   Total Bilirubin 0.1 (L) 0.3 - 1.2 mg/dL    Comment: REPEATED TO VERIFY   GFR calc non Af Amer >90 >90 mL/min   GFR calc Af Amer >90 >90 mL/min    Comment: (NOTE) The eGFR has been calculated using the CKD EPI equation. This calculation has not been validated in all clinical situations. eGFR's persistently <90 mL/min signify possible Chronic Kidney Disease.    Anion gap 11 5 - 15  Uric acid     Status: None   Collection Time: 08/08/14  8:00 PM  Result Value Ref Range   Uric Acid, Serum 5.2 2.4 - 7.0 mg/dL  Lactate dehydrogenase     Status: None   Collection Time: 08/08/14  8:00 PM  Result Value Ref Range   LDH 147 94 - 250 U/L    Review of Systems  Eyes: Positive for blurred vision.  Cardiovascular: Positive for leg swelling.  Gastrointestinal: Positive for heartburn (Takes 8-10 tums per day) and nausea. Negative for vomiting.  Genitourinary: Negative for dysuria, urgency, frequency and hematuria.  Neurological: Positive for headaches (Dull ).   Physical Exam   Blood pressure 124/83, pulse 80, temperature 97.9 F (36.6 C), temperature source Oral, resp. rate 16, height 5\' 3"  (1.6 m), weight 98.998 kg (218 lb 4 oz), unknown if currently breastfeeding.  Physical Exam  Constitutional: She is oriented to person, place, and time. She appears well-nourished. No distress.  HENT:  Head: Normocephalic.  Cardiovascular: Normal rate and normal heart sounds.   Respiratory: Effort normal and breath sounds normal.  GI:  Abdomen: soft, gravid, no RUQ tenderness Ex: no edema, DTRs 2+, no clonus Headache: mild 2/10  Musculoskeletal: Normal range of motion. She exhibits no edema.  Neurological: She is alert and oriented to person, place, and time.  Skin: Skin is warm. She is not diaphoretic.  Psychiatric: Her behavior  is normal.    Fetal Tracing: Baseline: 140 bpm  Variability: moderate  Accelerations: 15x15 Decelerations: none Toco: occasional contraction   MAU Course  Procedures  None  MDM  She currently rates her pain 2/10 Discussed patient with Dr. 4/10  Assessment and Plan   A:  1. Pregnancy induced hypertension, antepartum   2. Gastroesophageal reflux disease, esophagitis presence not specified    P;  Discharge home in stable condition RX: Zantac Preeclampsia precautions Return to MAU if symptoms worsen Follow up with MD as scheduled (Tuesday) Kick counts  Labor precautions  Lezlie Lye, NP 08/08/2014 11:34 PM

## 2014-08-08 NOTE — Discharge Instructions (Signed)

## 2014-08-08 NOTE — MAU Note (Signed)
Pt reports she [facility] this morning 140/85 and at 6 pm, it was 148/100.  Has a mild headache, nausea, wears glasses but it feels like it is harder to focus .  No bleeding. No leaking. Thinks baby's movement seems sluggish over this week.  Had BPP at office 8 out of 8.

## 2014-08-12 ENCOUNTER — Telehealth (HOSPITAL_COMMUNITY): Payer: Self-pay | Admitting: *Deleted

## 2014-08-12 ENCOUNTER — Encounter (HOSPITAL_COMMUNITY): Payer: Self-pay | Admitting: *Deleted

## 2014-08-12 NOTE — Telephone Encounter (Signed)
Preadmission screen  

## 2014-08-14 NOTE — H&P (Signed)
NAME:  Victoria Cunningham, [name]               ACCOUNT NO.:  [id number]  MEDICAL RECORD NO.:  [id number]  LOCATION:                                FACILITY:  [facility]  PHYSICIAN:  Victoria Cunningham, M.D.DATE OF BIRTH:  05-24-1975  DATE OF ADMISSION:  08/15/2014 DATE OF DISCHARGE:  08/17/2014                             HISTORY & PHYSICAL   CHIEF COMPLAINT:  Labor induction at term, favorable cervix.  HISTORY OF PRESENT ILLNESS:  A 39 year old, G4, P3-0-0-3, EDD is August 18, 2014, presents for labor induction at term.  The favorable cervix at 3 cm dilated.  She has a history of a positive GBS screen.  Her 3-hour GTT was normal.  PAST MEDICAL HISTORY:  She has had 3 vaginal deliveries at term.  For the remainder of her prenatal history, please see the [facility] form for details.  PHYSICAL EXAMINATION:  VITAL SIGNS:  Temperature 98.2, blood pressure 130/80.  HEENT:  Unremarkable. NECK:  Supple without masses. LUNGS:  Clear. CARDIOVASCULAR:  Regular rate and rhythm without murmurs, rubs, gallops. BREASTS:  Not examined. ABDOMEN:  Term fundal height.  Fetal heart rate 140, cervix was 3, 75%, vertex -1.  Membranes intact. EXTREMITIES:  Unremarkable. NEUROLOGIC:  Unremarkable.  IMPRESSION:  Term pregnancy, favorable cervix, history of positive GBS. We will admit for labor induction with IV antibiotics in labor.     Victoria Cunningham, M.D.     RMH/MEDQ  D:  08/14/2014  T:  08/14/2014  Job:  [id number]

## 2014-08-15 ENCOUNTER — Inpatient Hospital Stay (HOSPITAL_COMMUNITY): Payer: 59 | Admitting: Anesthesiology

## 2014-08-15 ENCOUNTER — Inpatient Hospital Stay (HOSPITAL_COMMUNITY)
Admission: RE | Admit: 2014-08-15 | Discharge: 2014-08-15 | Disposition: A | Discharge status: Home / Self Care | Payer: 59 | Source: Ambulatory Visit | Attending: Obstetrics and Gynecology | Admitting: Obstetrics and Gynecology

## 2014-08-15 ENCOUNTER — Encounter (HOSPITAL_COMMUNITY): Payer: Self-pay | Admitting: *Deleted

## 2014-08-15 ENCOUNTER — Inpatient Hospital Stay (HOSPITAL_COMMUNITY)
Admission: AD | Admit: 2014-08-15 | Discharge: 2014-08-17 | DRG: 775 | Disposition: A | Discharge status: Home / Self Care | Payer: 59 | Source: Ambulatory Visit | Attending: Obstetrics and Gynecology | Admitting: Obstetrics and Gynecology

## 2014-08-15 DIAGNOSIS — Z3A39 39 weeks gestation of pregnancy: Secondary | ICD-10-CM | POA: Diagnosis present

## 2014-08-15 DIAGNOSIS — O9989 Other specified diseases and conditions complicating pregnancy, childbirth and the puerperium: Secondary | ICD-10-CM | POA: Diagnosis present

## 2014-08-15 LAB — CBC
HCT: 34.5 % — ABNORMAL LOW (ref 36.0–46.0)
HCT: 30.4 % — ABNORMAL LOW (ref 36.0–46.0)
HEMOGLOBIN: 12.2 g/dL (ref 12.0–15.0)
Hemoglobin: 10.5 g/dL — ABNORMAL LOW (ref 12.0–15.0)
MCH: 30.1 pg (ref 26.0–34.0)
MCH: 29.6 pg (ref 26.0–34.0)
MCHC: 35.4 g/dL (ref 30.0–36.0)
MCHC: 34.5 g/dL (ref 30.0–36.0)
MCV: 85.2 fL (ref 78.0–100.0)
MCV: 85.6 fL (ref 78.0–100.0)
Platelets: 185 10*3/uL (ref 150–400)
Platelets: 173 10*3/uL (ref 150–400)
RBC: 4.05 MIL/uL (ref 3.87–5.11)
RBC: 3.55 MIL/uL — ABNORMAL LOW (ref 3.87–5.11)
RDW: 13.1 % (ref 11.5–15.5)
RDW: 13.2 % (ref 11.5–15.5)
WBC: 7.5 10*3/uL (ref 4.0–10.5)
WBC: 12.8 10*3/uL — ABNORMAL HIGH (ref 4.0–10.5)

## 2014-08-15 LAB — RPR: RPR Ser Ql: NONREACTIVE

## 2014-08-15 LAB — TYPE AND SCREEN
ABO/RH(D): AB POS
Antibody Screen: NEGATIVE

## 2014-08-15 LAB — ABO/RH: ABO/RH(D): AB POS

## 2014-08-15 MED ORDER — EPHEDRINE 5 MG/ML INJ
10.0000 mg | INTRAVENOUS | Status: DC | PRN
  Filled 2014-08-15: qty 2

## 2014-08-15 MED ORDER — OXYCODONE-ACETAMINOPHEN 5-325 MG PO TABS: 1.0000 | ORAL_TABLET | ORAL | Status: DC | PRN

## 2014-08-15 MED ORDER — ONDANSETRON HCL 4 MG/2ML IJ SOLN: 4.0000 mg | Freq: Four times a day (QID) | INTRAMUSCULAR | Status: DC | PRN

## 2014-08-15 MED ORDER — DIPHENHYDRAMINE HCL 25 MG PO CAPS: 25.0000 mg | ORAL_CAPSULE | Freq: Four times a day (QID) | ORAL | Status: DC | PRN

## 2014-08-15 MED ORDER — TETANUS-DIPHTH-ACELL PERTUSSIS 5-2.5-18.5 LF-MCG/0.5 IM SUSP: 0.5000 mL | Freq: Once | INTRAMUSCULAR | Status: DC

## 2014-08-15 MED ORDER — DIPHENHYDRAMINE HCL 50 MG/ML IJ SOLN: 12.5000 mg | INTRAMUSCULAR | Status: DC | PRN

## 2014-08-15 MED ORDER — WITCH HAZEL-GLYCERIN EX PADS: 1.0000 "application " | MEDICATED_PAD | CUTANEOUS | Status: DC | PRN

## 2014-08-15 MED ORDER — FLEET ENEMA 7-19 GM/118ML RE ENEM: 1.0000 | ENEMA | Freq: Every day | RECTAL | Status: DC | PRN

## 2014-08-15 MED ORDER — ZOLPIDEM TARTRATE 5 MG PO TABS
5.0000 mg | ORAL_TABLET | Freq: Every evening | ORAL | Status: DC | PRN
Start: 2014-08-15 — End: 2014-08-17

## 2014-08-15 MED ORDER — DIBUCAINE 1 % RE OINT: 1.0000 "application " | TOPICAL_OINTMENT | RECTAL | Status: DC | PRN

## 2014-08-15 MED ORDER — GUAIFENESIN 100 MG/5ML PO SYRP: 200.0000 mg | ORAL_SOLUTION | ORAL | Status: DC | PRN

## 2014-08-15 MED ORDER — OXYTOCIN 40 UNITS IN LACTATED RINGERS INFUSION - SIMPLE MED
INTRAVENOUS | Status: AC
  Administered 2014-08-15: 40 [IU] via INTRAVENOUS
  Filled 2014-08-15: qty 1000

## 2014-08-15 MED ORDER — PENICILLIN G POTASSIUM 5000000 UNITS IJ SOLR
2.5000 10*6.[IU] | INTRAMUSCULAR | Status: DC
  Administered 2014-08-15: 2.5 10*6.[IU] via INTRAVENOUS
  Filled 2014-08-15 (×5): qty 2.5

## 2014-08-15 MED ORDER — FENTANYL 2.5 MCG/ML BUPIVACAINE 1/10 % EPIDURAL INFUSION (WH - ANES): 14.0000 mL/h | INTRAMUSCULAR | Status: DC | PRN

## 2014-08-15 MED ORDER — ONDANSETRON HCL 4 MG PO TABS: 4.0000 mg | ORAL_TABLET | ORAL | Status: DC | PRN

## 2014-08-15 MED ORDER — PRENATAL MULTIVITAMIN CH
1.0000 | ORAL_TABLET | Freq: Every day | ORAL | Status: DC
  Administered 2014-08-16: 1 via ORAL
  Filled 2014-08-15: qty 1

## 2014-08-15 MED ORDER — OXYCODONE-ACETAMINOPHEN 5-325 MG PO TABS
2.0000 | ORAL_TABLET | ORAL | Status: DC | PRN
  Administered 2014-08-15: 2 via ORAL
  Filled 2014-08-15: qty 2

## 2014-08-15 MED ORDER — PENICILLIN G POTASSIUM 5000000 UNITS IJ SOLR
5.0000 10*6.[IU] | Freq: Once | INTRAVENOUS | Status: AC
  Administered 2014-08-15: 5 10*6.[IU] via INTRAVENOUS
  Filled 2014-08-15: qty 5

## 2014-08-15 MED ORDER — BISACODYL 10 MG RE SUPP: 10.0000 mg | Freq: Every day | RECTAL | Status: DC | PRN

## 2014-08-15 MED ORDER — LIDOCAINE HCL (PF) 1 % IJ SOLN
30.0000 mL | INTRAMUSCULAR | Status: DC | PRN
  Filled 2014-08-15: qty 30

## 2014-08-15 MED ORDER — LACTATED RINGERS IV SOLN: 500.0000 mL | Freq: Once | INTRAVENOUS | Status: DC

## 2014-08-15 MED ORDER — OXYCODONE-ACETAMINOPHEN 5-325 MG PO TABS
1.0000 | ORAL_TABLET | ORAL | Status: DC | PRN
  Administered 2014-08-16: 1 via ORAL
  Filled 2014-08-15: qty 1

## 2014-08-15 MED ORDER — CITRIC ACID-SODIUM CITRATE 334-500 MG/5ML PO SOLN: 30.0000 mL | ORAL | Status: DC | PRN

## 2014-08-15 MED ORDER — FENTANYL 2.5 MCG/ML BUPIVACAINE 1/10 % EPIDURAL INFUSION (WH - ANES)
14.0000 mL/h | INTRAMUSCULAR | Status: DC | PRN
  Administered 2014-08-15: 14 mL/h via EPIDURAL
  Filled 2014-08-15: qty 125

## 2014-08-15 MED ORDER — OXYTOCIN 40 UNITS IN LACTATED RINGERS INFUSION - SIMPLE MED: 500.0000 mL/h | Freq: Once | INTRAVENOUS | Status: DC

## 2014-08-15 MED ORDER — LACTATED RINGERS IV SOLN
INTRAVENOUS | Status: DC
  Administered 2014-08-15 (×2): via INTRAVENOUS

## 2014-08-15 MED ORDER — ACETAMINOPHEN 325 MG PO TABS: 650.0000 mg | ORAL_TABLET | ORAL | Status: DC | PRN

## 2014-08-15 MED ORDER — MEASLES, MUMPS & RUBELLA VAC ~~LOC~~ INJ: 0.5000 mL | INJECTION | Freq: Once | SUBCUTANEOUS | Status: DC

## 2014-08-15 MED ORDER — PHENYLEPHRINE 40 MCG/ML (10ML) SYRINGE FOR IV PUSH (FOR BLOOD PRESSURE SUPPORT)
80.0000 ug | PREFILLED_SYRINGE | INTRAVENOUS | Status: DC | PRN
  Filled 2014-08-15: qty 2
  Filled 2014-08-15: qty 20

## 2014-08-15 MED ORDER — GUAIFENESIN-DM 100-10 MG/5ML PO SYRP
5.0000 mL | ORAL_SOLUTION | ORAL | Status: DC | PRN
  Administered 2014-08-15 – 2014-08-17 (×3): 5 mL via ORAL
  Filled 2014-08-15 (×4): qty 10

## 2014-08-15 MED ORDER — METHYLERGONOVINE MALEATE 0.2 MG/ML IJ SOLN
0.2000 mg | Freq: Once | INTRAMUSCULAR | Status: AC
  Administered 2014-08-15: 0.2 mg via INTRAMUSCULAR

## 2014-08-15 MED ORDER — BENZOCAINE-MENTHOL 20-0.5 % EX AERO
1.0000 | INHALATION_SPRAY | CUTANEOUS | Status: DC | PRN
Start: 2014-08-15 — End: 2014-08-17
  Administered 2014-08-17: 1 via TOPICAL
  Filled 2014-08-15: qty 56

## 2014-08-15 MED ORDER — FLEET ENEMA 7-19 GM/118ML RE ENEM: 1.0000 | ENEMA | RECTAL | Status: DC | PRN

## 2014-08-15 MED ORDER — OXYTOCIN 40 UNITS IN LACTATED RINGERS INFUSION - SIMPLE MED
62.5000 mL/h | INTRAVENOUS | Status: DC
  Administered 2014-08-15: 40 [IU] via INTRAVENOUS

## 2014-08-15 MED ORDER — OXYTOCIN 40 UNITS IN LACTATED RINGERS INFUSION - SIMPLE MED
1.0000 m[IU]/min | INTRAVENOUS | Status: DC
  Administered 2014-08-15: 2 m[IU]/min via INTRAVENOUS
  Filled 2014-08-15: qty 1000

## 2014-08-15 MED ORDER — METHYLERGONOVINE MALEATE 0.2 MG/ML IJ SOLN
INTRAMUSCULAR | Status: AC
  Administered 2014-08-15: 0.2 mg via INTRAMUSCULAR
  Filled 2014-08-15: qty 1

## 2014-08-15 MED ORDER — OXYTOCIN BOLUS FROM INFUSION: 500.0000 mL | INTRAVENOUS | Status: DC

## 2014-08-15 MED ORDER — LANOLIN HYDROUS EX OINT: TOPICAL_OINTMENT | CUTANEOUS | Status: DC | PRN

## 2014-08-15 MED ORDER — LIDOCAINE HCL (PF) 1 % IJ SOLN
INTRAMUSCULAR | Status: DC | PRN
  Administered 2014-08-15: 6 mL
  Administered 2014-08-15: 4 mL

## 2014-08-15 MED ORDER — ONDANSETRON HCL 4 MG/2ML IJ SOLN
4.0000 mg | INTRAMUSCULAR | Status: DC | PRN
  Administered 2014-08-15: 4 mg via INTRAVENOUS
  Filled 2014-08-15: qty 2

## 2014-08-15 MED ORDER — IBUPROFEN 800 MG PO TABS
800.0000 mg | ORAL_TABLET | Freq: Three times a day (TID) | ORAL | Status: DC | PRN
  Administered 2014-08-15 – 2014-08-17 (×5): 800 mg via ORAL
  Filled 2014-08-15 (×5): qty 1

## 2014-08-15 MED ORDER — OXYCODONE-ACETAMINOPHEN 5-325 MG PO TABS: 2.0000 | ORAL_TABLET | ORAL | Status: DC | PRN

## 2014-08-15 MED ORDER — LACTATED RINGERS IV SOLN: 500.0000 mL | INTRAVENOUS | Status: DC | PRN

## 2014-08-15 MED ORDER — PHENYLEPHRINE 40 MCG/ML (10ML) SYRINGE FOR IV PUSH (FOR BLOOD PRESSURE SUPPORT)
80.0000 ug | PREFILLED_SYRINGE | INTRAVENOUS | Status: DC | PRN
  Filled 2014-08-15: qty 2

## 2014-08-15 MED ORDER — TERBUTALINE SULFATE 1 MG/ML IJ SOLN
0.2500 mg | Freq: Once | INTRAMUSCULAR | Status: DC | PRN
  Filled 2014-08-15: qty 1

## 2014-08-15 MED ORDER — SIMETHICONE 80 MG PO CHEW
80.0000 mg | CHEWABLE_TABLET | ORAL | Status: DC | PRN
Start: 2014-08-15 — End: 2014-08-17

## 2014-08-15 MED ORDER — SENNOSIDES-DOCUSATE SODIUM 8.6-50 MG PO TABS
2.0000 | ORAL_TABLET | ORAL | Status: DC
  Administered 2014-08-15 – 2014-08-17 (×2): 2 via ORAL
  Filled 2014-08-15 (×2): qty 2

## 2014-08-15 NOTE — Progress Notes (Addendum)
LR with 40 piticon bolus started. Methergine 0.2 mg IM given at this time. CBC drawn by lab.  Pt c/o of nausea.

## 2014-08-15 NOTE — Anesthesia Procedure Notes (Signed)
Epidural Patient location during procedure: OB  Preanesthetic Checklist Completed: patient identified, site marked, surgical consent, pre-op evaluation, timeout performed, IV checked, risks and benefits discussed and monitors and equipment checked  Epidural Patient position: sitting Prep: site prepped and draped and DuraPrep Patient monitoring: continuous pulse ox and blood pressure Approach: midline Location: L3-L4 Injection technique: LOR air  Needle:  Needle type: Tuohy  Needle gauge: 17 G Needle length: 9 cm and 9 Needle insertion depth: 7 cm Catheter type: closed end flexible Catheter size: 19 Gauge Catheter at skin depth: 14 cm Test dose: negative  Assessment Events: blood not aspirated, injection not painful, no injection resistance, negative IV test and no paresthesia  Additional Notes Dosing of Epidural:  1st dose, through catheter .............................................  Xylocaine 40 mg  2nd dose, through catheter, after waiting 3 minutes.........Xylocaine 60 mg    ( 1% Xylo charted as a single dose in Epic Meds for ease of charting; actual dosing was fractionated as above, for saftey's sake)  As each dose occurred, patient was free of IV sx; and patient exhibited no evidence of SA injection.  Patient is more comfortable after epidural dosed. Please see RN's note for documentation of vital signs,and FHR which are stable.  Patient reminded not to try to ambulate with numb legs, and that an RN must be present when she attempts to get up.       

## 2014-08-15 NOTE — Progress Notes (Signed)
Pt up to Bathroom. Passed large amount of clots in toilet. Pt c/o of lightheadedness and dizziness. Back to bed with stedy. Fundus massaged with large amount of clots.  Estimated Blood loss 750 cc.

## 2014-08-15 NOTE — Anesthesia Preprocedure Evaluation (Signed)
Anesthesia Evaluation  Patient identified by MRN, date of birth, ID band Patient awake    Reviewed: Allergy & Precautions, H&P , Patient's Chart, lab work & pertinent test results  Airway Mallampati: II  TM Distance: >3 FB Neck ROM: full    Dental  (+) Teeth Intact   Pulmonary  breath sounds clear to auscultation        Cardiovascular Rhythm:regular Rate:Normal     Neuro/Psych    GI/Hepatic   Endo/Other  diabetes, Gestational  Renal/GU      Musculoskeletal   Abdominal   Peds  Hematology   Anesthesia Other Findings       Reproductive/Obstetrics (+) Pregnancy                             Anesthesia Physical Anesthesia Plan  ASA: III  Anesthesia Plan: Epidural   Post-op Pain Management:    Induction:   Airway Management Planned:   Additional Equipment:   Intra-op Plan:   Post-operative Plan:   Informed Consent: I have reviewed the patients History and Physical, chart, labs and discussed the procedure including the risks, benefits and alternatives for the proposed anesthesia with the patient or authorized representative who has indicated his/her understanding and acceptance.   Dental Advisory Given  Plan Discussed with:   Anesthesia Plan Comments: (Labs checked- platelets confirmed with RN in room. Fetal heart tracing, per RN, reported to be stable enough for sitting procedure. Discussed epidural, and patient consents to the procedure:  included risk of possible headache,backache, failed block, allergic reaction, and nerve injury. This patient was asked if she had any questions or concerns before the procedure started.)        Anesthesia Quick Evaluation  

## 2014-08-15 NOTE — Progress Notes (Signed)
AROM, 3/50/-2, clr AF, will start PCN

## 2014-08-15 NOTE — Progress Notes (Signed)
Dr Marcelle OverlieHolland called regarding patients increased bleeding with clots. Orders received to give Methergine and to order a stat CBC and call back with results.

## 2014-08-16 LAB — CBC
HCT: 27.9 % — ABNORMAL LOW (ref 36.0–46.0)
HEMOGLOBIN: 9.8 g/dL — AB (ref 12.0–15.0)
MCH: 30.3 pg (ref 26.0–34.0)
MCHC: 35.1 g/dL (ref 30.0–36.0)
MCV: 86.4 fL (ref 78.0–100.0)
Platelets: 153 10*3/uL (ref 150–400)
RBC: 3.23 MIL/uL — AB (ref 3.87–5.11)
RDW: 13.3 % (ref 11.5–15.5)
WBC: 9.7 10*3/uL (ref 4.0–10.5)

## 2014-08-16 NOTE — Anesthesia Postprocedure Evaluation (Signed)
Anesthesia Post Note  Patient: Victoria RafterMelanie A Cunningham  Procedure(s) Performed: * No procedures listed *  Anesthesia type: Epidural  Patient location: Mother/Baby  Post pain: Pain level controlled  Post assessment: Post-op Vital signs reviewed  Last Vitals:  Filed Vitals:   08/16/14 0627  BP: 115/59  Pulse: 78  Temp: 36.6 C  Resp: 18    Post vital signs: Reviewed  Level of consciousness:alert  Complications: No apparent anesthesia complications

## 2014-08-16 NOTE — Lactation Note (Addendum)
This note was copied from the chart of Victoria Evangelyn Crouse. Lactation Consultation Note  Patient Name: Victoria Cunningham [id number] Date: 08/16/2014 Reason for consult: Initial assessment   Initial consult at 25 hours of life.  GA 39.4; BW 7#,3oz.  Infant was circumcised this morning at 8am.  Parents verified no documented feedings between 0200-1545 d/t circumcision and sleepy baby.  Parents stated that siblings have history of jaundice.  RN stated infant's jaundice levels will be checked tonight. Infant has breastfed x3 (30-40 min) + 3 attempts (0 min) since birth; voids-1; stools-2 in past 24 hours with LS-8 by RN. Taught hand expression and spoon fed few drops of colostrum to infant.  He was cuing to eat so attempted to latch.   Assisted with latching on right side football hold.  Taught sandwiching of breast and asymmetrical latching technique.  Taught dad how to assist with latch using teacup hold.  Infant had difficulty "finding" nipple in his mouth to latch but then when he did latch he only took a few sucks and went to sleep.  Mom has long erect nipples.  Mom laid infant on bed and he began to cue again. Mom re-latched infant using cross-cradle hold taught by [facility] on right side.  Infant took a few sucks and then went to sleep again.  LS-7 LC concerned about potential rising bili levels based on 15 hour stretch of no breastfeedings, sibling history of jaundice, and current alternating sleepy/ cueing behavior at breast.  Discussed the need with parents to keep infant at breast actively eating & hand expressing/pumping to give extra EBM to infant throughout the rest of the evening.  Parents very receptive to all teaching and fully engaged with infant's care.  Encouraged parents to keep feeding with cues awaking infant as needed for feedings.  Educated parents about cluster feeding.  Lots of education and teaching done. Hand pump given and taught how to use.  #24 flange appropriate fit at this time but as  mom's milk increases she may need a larger flange size d/t size of nipples.  Larger drops of colostrum began to flow with demonstrating hand pump, so encouraged parents to work on hand pumping when infant is not feeding in addition to hand expression. Colostrum collection containers given and curved tip syringe for obtaining colostrum from pump and storing colostrum for feedings.   Discussed with parents potential need to give some formula based on jaundice levels later tonight and mom's milk supply.  Encouraged parents to continue working on expressing milk.   Discussed with RN current consult and potential need to set up DEBP later tonight.  RN stated she saw the 1630 feeding and RN reported the infant was more engaged, consistently sucking with the 1630 feeding.   Lactation brochure given and informed of outpatient services after discharge and hospital support group. LC will continue to monitor infant's progress.   Encouraged parents to call for assistance as needed with latching.   Maternal Data Formula Feeding for Exclusion: No Has patient been taught Hand Expression?: Yes (with return demonstration and observation of small drops of colostrum) Does the patient have breastfeeding experience prior to this delivery?: Yes  Feeding Feeding Type: Breast Fed Length of feed: 30 min  LATCH Score/Interventions Latch: Repeated attempts needed to sustain latch, nipple held in mouth throughout feeding, stimulation needed to elicit sucking reflex. (infant sleepy at breast) Intervention(s): Adjust position;Assist with latch;Breast compression  Audible Swallowing: A few with stimulation Intervention(s): Hand expression  Type of  Nipple: Everted at rest and after stimulation  Comfort (Breast/Nipple): Soft / non-tender     Hold (Positioning): Assistance needed to correctly position infant at breast and maintain latch. Intervention(s): Breastfeeding basics reviewed;Support Pillows;Position  options;Skin to skin  LATCH Score: 7  Lactation Tools Discussed/Used WIC Program: No   Consult Status Consult Status: Follow-up Date: 08/17/14 Follow-up type: In-patient    Lendon KaVann, Rima Blizzard Walker 08/16/2014, 5:54 PM

## 2014-08-16 NOTE — Progress Notes (Signed)
Post Partum Day 1 Subjective: no complaints  Objective: Blood pressure 115/59, pulse 78, temperature 97.9 F (36.6 C), temperature source Oral, resp. rate 18, height 5\' 3"  (1.6 m), weight 217 lb (98.431 kg), SpO2 99 %, unknown if currently breastfeeding.  Physical Exam:  General: alert Lochia: appropriate Uterine Fundus: firm Incision: healing well DVT Evaluation: No evidence of DVT seen on physical exam.   Recent Labs  08/15/14 2035 08/16/14 0555  HGB 10.5* 9.8*  HCT 30.4* 27.9*    Assessment/Plan: Plan for discharge tomorrow   LOS: 1 day   Aundrea Horace M 08/16/2014, 9:28 AM

## 2014-08-17 MED ORDER — GUAIFENESIN 100 MG/5ML PO SYRP: 200.0000 mg | ORAL_SOLUTION | ORAL | Status: DC | PRN

## 2014-08-17 MED ORDER — IBUPROFEN 800 MG PO TABS: 800.0000 mg | ORAL_TABLET | Freq: Three times a day (TID) | ORAL | Status: DC | PRN

## 2014-08-17 NOTE — Lactation Note (Signed)
This note was copied from the chart of Victoria Earlie LouMelanie Hardage. Lactation Consultation Note  Patient Name: Victoria Cunningham Reason for consult: Follow-up assessment  with this mom and term baby, now breast feeding well. I reviewed latching positiing in cross cradle for a deeper latch , and breast care reviewed. Mom has easily expressed colostrum. Mom knows to call for questions/concerns   Maternal Data    Feeding Feeding Type: Breast Fed  LATCH Score/Interventions Latch: Grasps breast easily, tongue down, lips flanged, rhythmical sucking.  Audible Swallowing: A few with stimulation  Type of Nipple: Everted at rest and after stimulation  Comfort (Breast/Nipple): Soft / non-tender     Hold (Positioning): Assistance needed to correctly position infant at breast and maintain latch. Intervention(s): Breastfeeding basics reviewed;Support Pillows;Position options;Skin to skin  LATCH Score: 8  Lactation Tools Discussed/Used     Consult Status Consult Status: Complete Follow-up type: Call as needed    Victoria Cunningham, Victoria Cunningham Cunningham, 9:05 AM

## 2014-08-17 NOTE — Discharge Summary (Signed)
Obstetric Discharge Summary Reason for Admission: induction of labor Prenatal Procedures: ultrasound Intrapartum Procedures: spontaneous vaginal delivery Postpartum Procedures: none Complications-Operative and Postpartum: 1 degree perineal laceration HEMOGLOBIN  Date Value Ref Range Status  08/16/2014 9.8* 12.0 - 15.0 g/dL Final   HCT  Date Value Ref Range Status  08/16/2014 27.9* 36.0 - 46.0 % Final    Physical Exam:  General: alert and cooperative Lochia: appropriate Uterine Fundus: firm Incision: healing well DVT Evaluation: No evidence of DVT seen on physical exam. Negative Homan's sign. No cords or calf tenderness. No significant calf/ankle edema.  Discharge Diagnoses: Term Pregnancy-delivered  Discharge Information: Date: 08/17/2014 Activity: pelvic rest Diet: routine Medications: PNV and Ibuprofen Condition: stable Instructions: refer to practice specific booklet Discharge to: home   Newborn Data: Live born female  Birth Weight: 7 lb 3.2 oz (3265 g) APGAR: 8, 9  Home with mother.  CURTIS,CAROL G 08/17/2014, 8:03 AM

## 2014-10-02 ENCOUNTER — Other Ambulatory Visit: Payer: Self-pay | Admitting: Obstetrics and Gynecology

## 2014-10-05 LAB — CYTOLOGY - PAP

## 2014-12-13 ENCOUNTER — Emergency Department (HOSPITAL_BASED_OUTPATIENT_CLINIC_OR_DEPARTMENT_OTHER): Payer: 59

## 2014-12-13 ENCOUNTER — Emergency Department (HOSPITAL_BASED_OUTPATIENT_CLINIC_OR_DEPARTMENT_OTHER)
Admission: EM | Admit: 2014-12-13 | Discharge: 2014-12-13 | Disposition: A | Discharge status: Home / Self Care | Payer: 59 | Attending: Emergency Medicine | Admitting: Emergency Medicine

## 2014-12-13 ENCOUNTER — Encounter (HOSPITAL_BASED_OUTPATIENT_CLINIC_OR_DEPARTMENT_OTHER): Payer: Self-pay | Admitting: *Deleted

## 2014-12-13 DIAGNOSIS — H538 Other visual disturbances: Secondary | ICD-10-CM | POA: Diagnosis not present

## 2014-12-13 DIAGNOSIS — Z8632 Personal history of gestational diabetes: Secondary | ICD-10-CM | POA: Diagnosis not present

## 2014-12-13 DIAGNOSIS — H547 Unspecified visual loss: Secondary | ICD-10-CM | POA: Diagnosis present

## 2014-12-13 DIAGNOSIS — Z3202 Encounter for pregnancy test, result negative: Secondary | ICD-10-CM | POA: Insufficient documentation

## 2014-12-13 LAB — I-STAT CHEM 8, ED
BUN: 18 mg/dL (ref 6–20)
CALCIUM ION: 1.22 mmol/L (ref 1.12–1.23)
CHLORIDE: 102 mmol/L (ref 101–111)
CREATININE: 0.9 mg/dL (ref 0.44–1.00)
GLUCOSE: 91 mg/dL (ref 65–99)
HCT: 45 % (ref 36.0–46.0)
HEMOGLOBIN: 15.3 g/dL — AB (ref 12.0–15.0)
POTASSIUM: 3.6 mmol/L (ref 3.5–5.1)
Sodium: 142 mmol/L (ref 135–145)
TCO2: 27 mmol/L (ref 0–100)

## 2014-12-13 LAB — PREGNANCY, URINE: PREG TEST UR: NEGATIVE

## 2014-12-13 MED ORDER — IOHEXOL 300 MG/ML  SOLN
75.0000 mL | Freq: Once | INTRAMUSCULAR | Status: AC | PRN
  Administered 2014-12-13: 75 mL via INTRAVENOUS

## 2014-12-13 NOTE — ED Provider Notes (Signed)
CSN: [id number]     Arrival date & time 12/13/14  1946 History  This chart was scribed for Blane Ohara, MD by Budd Palmer, ED Scribe. This patient was seen in room MH06/MH06 and the patient's care was started at 8:25 PM.    Chief Complaint  Patient presents with  . Loss of Vision   The history is provided by the patient. No language interpreter was used.   HPI Comments: Victoria Cunningham is a 39 y.o. female who presents to the Emergency Department complaining of two episodes of loss of vision at 5:45 PM (30 min) and 7:00PM ( ) today. She describes these episodes as a strip on the lower field of vision missing, and then blurry peripheral vision, as well as a general appearance of the visual field as that of a flag waving. She was unable to tell which of the eyes was worse. She notes that when she closed both eyes it felt about the same. She reports associated pressure on the right side of the head, but no pain. She has had no similar previous symptoms. She had a child 4 months ago and had a fairly normal pregnancy.  She has no other medical issues. She has just had a normal visual exam.  Pt denies slurred speech, weakness, numbness, and HA.  Past Medical History  Diagnosis Date  . No pertinent past medical history   . PONV (postoperative nausea and vomiting)   . Gestational diabetes    Past Surgical History  Procedure Laterality Date  . Lapriscopic      2009 r/o endometriosis   Family History  Problem Relation Age of Onset  . Anesthesia problems Neg Hx   . Heart disease Father   . Diabetes Father   . Cancer Father     leaukemia  . Cancer Maternal Grandfather   . Cancer Paternal Grandfather    History  Substance Use Topics  . Smoking status: Never Smoker   . Smokeless tobacco: Never Used  . Alcohol Use: No   OB History    Gravida Para Term Preterm AB TAB SAB Ectopic Multiple Living   0 0 0 0 0 0 4     Review of Systems  Eyes: Positive for visual disturbance.   Neurological: Negative for speech difficulty, weakness, numbness and headaches.  All other systems reviewed and are negative.   Allergies  Review of patient's allergies indicates no known allergies.  Home Medications   Prior to Admission medications   Medication Sig Start Date End Date Taking? Authorizing Provider  ibuprofen (ADVIL,MOTRIN) 800 MG tablet Take 1 tablet (800 mg total) by mouth every 8 (eight) hours as needed for moderate pain. 08/17/14   Julio Sicks, NP  Prenatal Vit-Fe Fumarate-FA (PRENATAL MULTIVITAMIN) TABS Take 1 tablet by mouth at bedtime.     Historical Provider, MD   BP 110/71 mmHg  Pulse 81  Temp(Src) 98.1 F (36.7 C) (Oral)  Resp 20  Ht  (1.6 m)  Wt 210 lb (95.255 kg)  BMI 37.21 kg/m2  SpO2 100% Physical Exam  Constitutional: She is oriented to person, place, and time. She appears well-developed and well-nourished. No distress.  HENT:  Head: Normocephalic and atraumatic.  Mouth/Throat: Oropharynx is clear and moist.  Eyes: Conjunctivae and EOM are normal. Pupils are equal, round, and reactive to light.  4 visual quadrants intact to fingers in both eyes.  Neck: Normal range of motion. Neck supple. No tracheal deviation present.  Cardiovascular: Normal rate.  Pulmonary/Chest: Breath sounds normal. No respiratory distress.  Abdominal: Soft.  Musculoskeletal: Normal range of motion.  Neurological: She is alert and oriented to person, place, and time. Coordination and gait normal. GCS eye subscore is 4. GCS verbal subscore is 5. GCS motor subscore is 6.  Reflex Scores:      Patellar reflexes are 2+ on the right side and 2+ on the left side.  5+ strength in UE and LE with f/e at major joints. Sensation to palpation intact in UE and LE. CNs 2-12 grossly intact.  EOMFI.  PERRL.   Finger nose and coordination intact bilateral.   Visual fields intact to finger testing.   Skin: Skin is warm and dry.  Psychiatric: She has a normal mood and affect. Her  behavior is normal.  Nursing note and vitals reviewed.   ED Course  Procedures  DIAGNOSTIC STUDIES: Oxygen Saturation is 100% on RA, normal by my interpretation.    COORDINATION OF CARE: 8:39 PM - Discussed plans to order diagnostic studies and imaging. Advised that if Sx returns pt may need an MRI. Pt advised of plan for treatment and pt agrees.  Labs Review Labs Reviewed  I-STAT CHEM 8, ED - Abnormal; Notable for the following:    Hemoglobin 15.3 (*)    All other components within normal limits  PREGNANCY, URINE  POC URINE PREG, ED    Imaging Review Ct Head W Wo Contrast  12/13/2014   CLINICAL DATA:  Initial evaluation for acute central vision loss, recently postpartum. Question venous thrombus  EXAM: CT HEAD WITHOUT AND WITH CONTRAST  TECHNIQUE: Contiguous axial images were obtained from the base of the skull through the vertex without and with intravenous contrast  CONTRAST:  75mL OMNIPAQUE IOHEXOL 300 MG/ML  SOLN  COMPARISON:  None.  FINDINGS: Cerebral volume within normal limits for patient age. No significant white matter disease.  No acute large vessel territory infarct. No intracranial hemorrhage. No mass lesion, midline shift, or mass effect. No hydrocephalus. No extra-axial fluid collection.  Dedicated post-contrast images demonstrate no abnormal enhancement. No filling defect to suggest dural venous thrombosis. 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 Deep veins including the straight sinus, vein of Galen, basal vein of Rosenthal, and inferior sagittal sinus appear patent.  Scalp soft tissues within normal limits. No acute abnormality about the orbits.  Calvarium intact. Paranasal sinuses and mastoid air cells are well pneumatized and free of fluid.  IMPRESSION: Normal head CT with no acute intracranial process identified. No evidence for venous thrombosis.   Electronically Signed   By: Rise Mu M.D.   On: 12/13/2014 22:01     EKG Interpretation None      MDM    Final diagnoses:  Blurry vision, bilateral   Patient presented after transient blurry central vision. Patient has normal neuro exam currently, no signs of retinal detachment, no symptoms. Discussed strict reasons to return and to follow-up with neurology and ophthalmology. CT scan no acute findings reviewed results.  Results and differential diagnosis were discussed with the patient/parent/guardian. Xrays were independently reviewed by myself.  Close follow up outpatient was discussed, comfortable with the plan.   Medications  iohexol (OMNIPAQUE) 300 MG/ML solution 75 mL (75 mLs Intravenous Contrast Given 12/13/14 2059)    Filed Vitals:   12/13/14 1950 12/13/14 2202  BP: 128/78 110/71  Pulse: 80 81  Temp: 98.1 F (36.7 C)   TempSrc: Oral   Resp: 18 20  Height: 5\' 3"  (1.6 m)   Weight: 210 lb (  95.255 kg)   SpO2: 100% 100%    Final diagnoses:  Blurry vision, bilateral      Blane Ohara, MD 12/13/14 2258

## 2014-12-13 NOTE — Discharge Instructions (Signed)
Return to the ER department or see a physician if symptoms return or new concerns. Follow-up with your eye doctor and primary care doctor closely. If you were given medicines take as directed.  If you are on coumadin or contraceptives realize their levels and effectiveness is altered by many different medicines.  If you have any reaction (rash, tongues swelling, other) to the medicines stop taking and see a physician.    If your blood pressure was elevated in the ER make sure you follow up for management with a primary doctor or return for chest pain, shortness of breath or stroke symptoms.  Please follow up as directed and return to the ER or see a physician for new or worsening symptoms.  Thank you. Filed Vitals:   12/13/14 1950  BP: 128/78  Pulse: 80  Temp: 98.1 F (36.7 C)  TempSrc: Oral  Resp: 18  Height:  (1.6 m)  Weight: 210 lb (95.255 kg)  SpO2: 100%    Blurred Vision You have been seen today complaining of blurred vision. This means you have a loss of ability to see small details.  CAUSES  Blurred vision can be a symptom of underlying eye problems, such as:  Aging of the eye (presbyopia).  Glaucoma.  Cataracts.  Eye infection.  Eye-related migraine.  Diabetes mellitus.  Fatigue.  Migraine headaches.  High blood pressure.  Breakdown of the back of the eye (macular degeneration).  Problems caused by some medications. The most common cause of blurred vision is the need for eyeglasses or a new prescription. Today in the emergency department, no cause for your blurred vision can be found. SYMPTOMS  Blurred vision is the loss of visual sharpness and detail (acuity). DIAGNOSIS  Should blurred vision continue, you should see your caregiver. If your caregiver is your primary care physician, he or she may choose to refer you to another specialist.  TREATMENT  Do not ignore your blurred vision. Make sure to have it checked out to see if further treatment or  referral is necessary. SEEK MEDICAL CARE IF:  You are unable to get into a specialist so we can help you with a referral. SEEK IMMEDIATE MEDICAL CARE IF: You have severe eye pain, severe headache, or sudden loss of vision. MAKE SURE YOU:   Understand these instructions.  Will watch your condition.  Will get help right away if you are not doing well or get worse. Document Released: 05/04/2003 Document Revised: 07/24/2011 Document Reviewed: 12/04/2007 [facility] Patient Information 2015 [place], [place]. This information is not intended to replace advice given to you by your health care provider. Make sure you discuss any questions you have with your health care provider.

## 2014-12-13 NOTE — ED Notes (Signed)
Pt reports that she had an episode at 545pm today where she had loss of her central vision and her peripheral vision was blurry.  States that it lasted .  Had another episode at 7pm today and lasted about 20 minutes.  Reports that she has right sided head pressure, denies HA.

## 2015-04-22 ENCOUNTER — Encounter: Payer: Self-pay | Admitting: Internal Medicine

## 2016-06-21 IMAGING — CT CT HEAD WO/W CM
2 of 3 series · 15 of 30 positions shown, 18 images · IV contrast (omnipaque)
Comparison: None.

CLINICAL DATA: Initial evaluation for acute central vision loss,
recently postpartum. Question venous thrombus

EXAM:
CT HEAD WITHOUT AND WITH CONTRAST
TECHNIQUE: Contiguous axial images were obtained from the base of the skull
through the vertex without and with intravenous contrast
CONTRAST:  75mL OMNIPAQUE IOHEXOL 300 MG/ML  SOLN

[Series 2: head w/o · axial · non-contrast · 0.43mm/px · z∈[+1343,+1443]mm · 4 of 36 slices shown]
[im 8/36  brain]
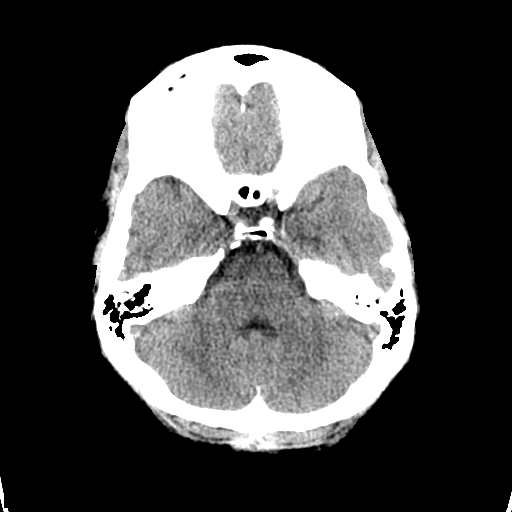
[im 15/36  brain]
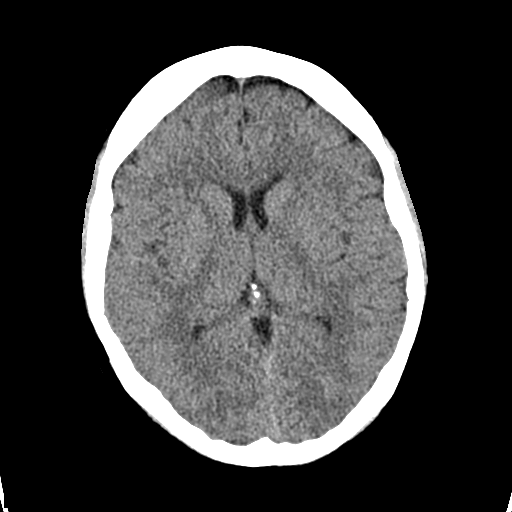
[im 22/36  brain]
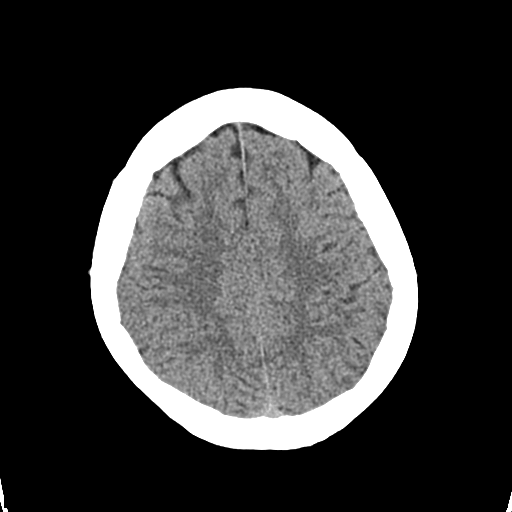
[im 29/36  brain]
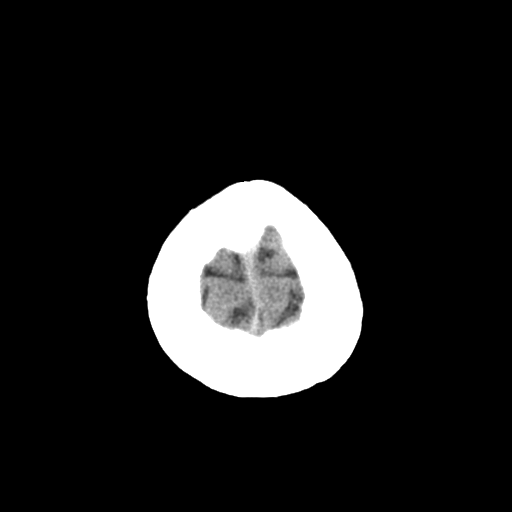

[Series 4: angio 2.0 b26f · axial · 0.47mm/px · z∈[+1290,+1428]mm · 11 of 83 slices shown, 14 images]
[im 7/83  brain]
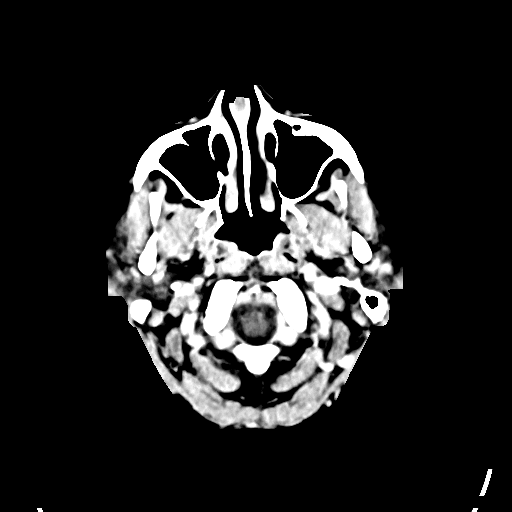
[im 7/83  bone]
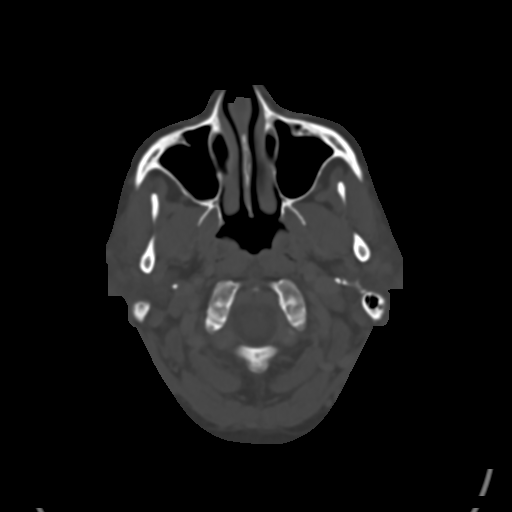
[im 14/83  brain]
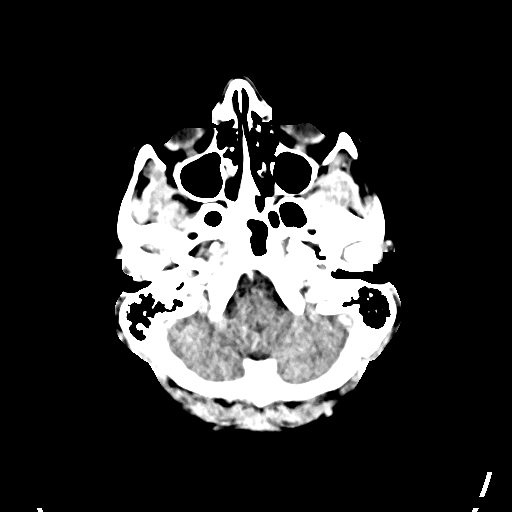
[im 21/83  brain]
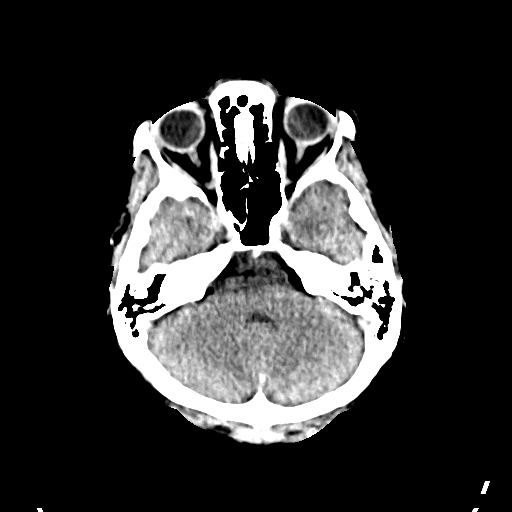
[im 28/83  brain]
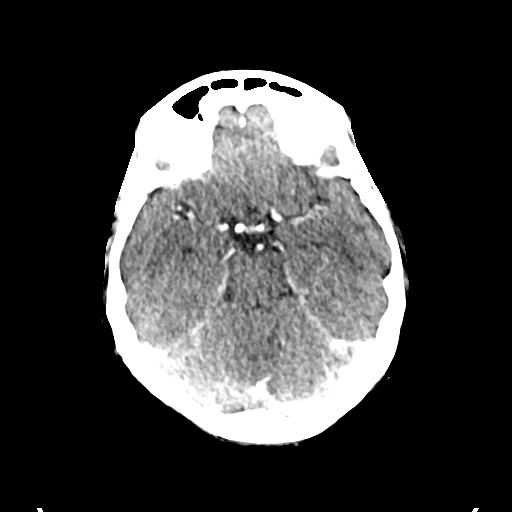
[im 35/83  brain]
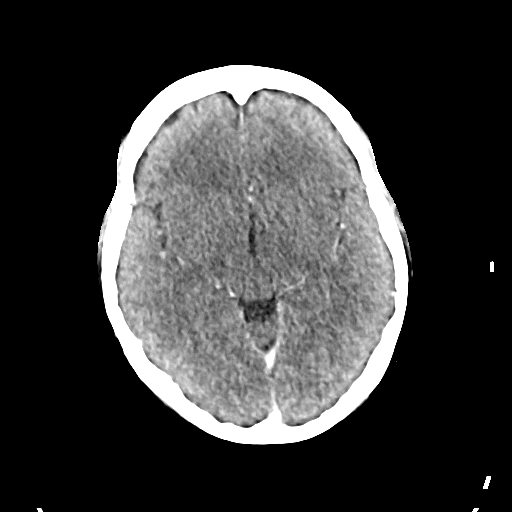
[im 35/83  bone]
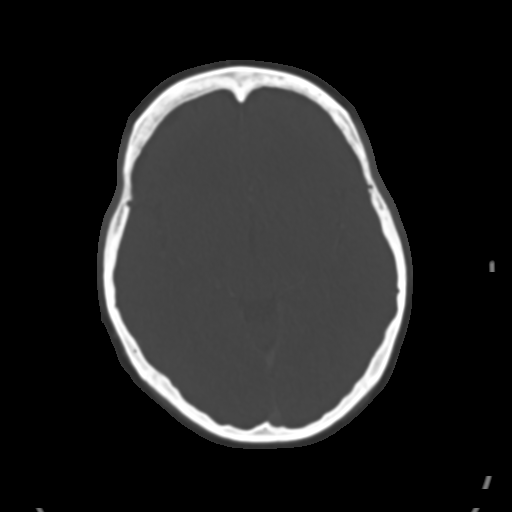
[im 42/83  brain]
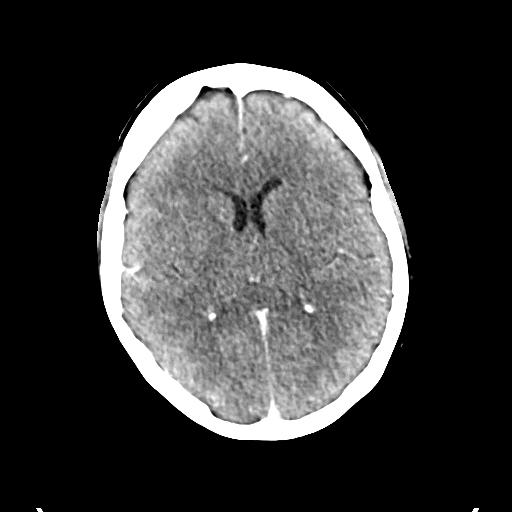
[im 48/83  brain]
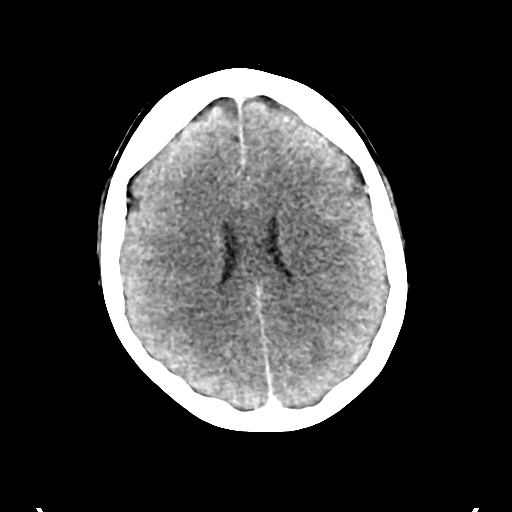
[im 55/83  brain]
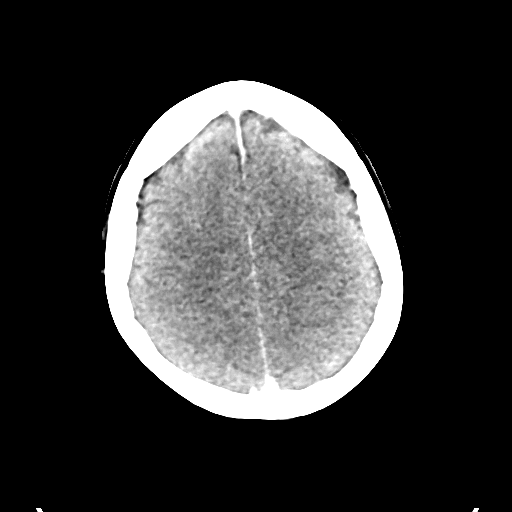
[im 62/83  brain]
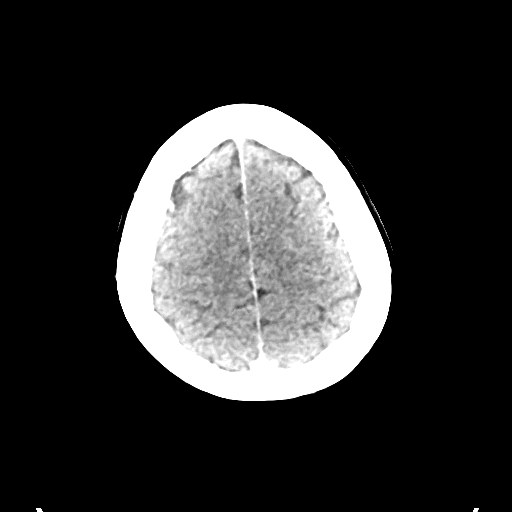
[im 62/83  bone]
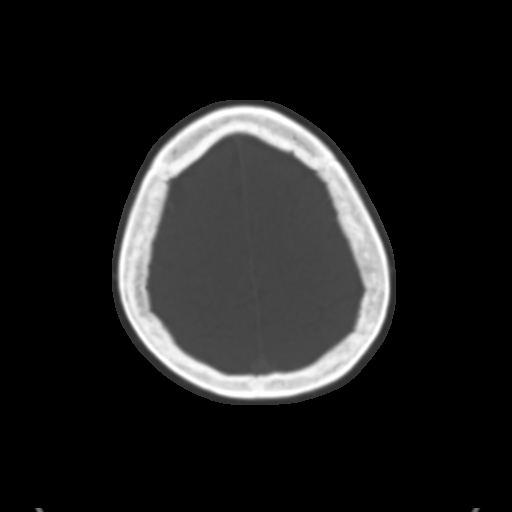
[im 69/83  brain]
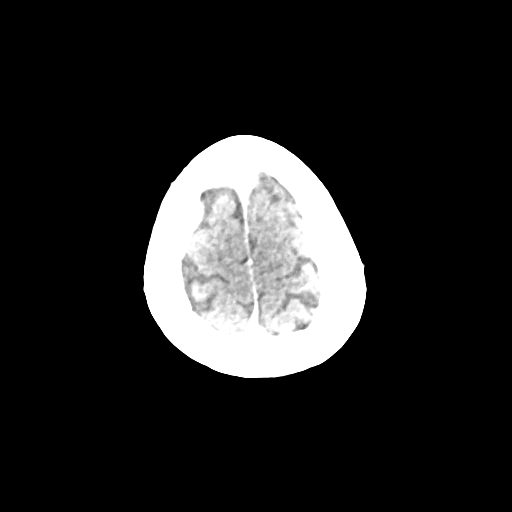
[im 76/83  brain]
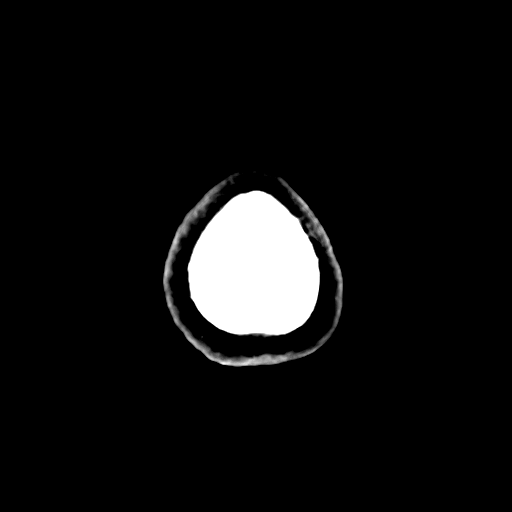

[15 of 30 positions shown; findings below may reference images not displayed]

FINDINGS: Cerebral volume within normal limits for patient age. No significant
white matter disease.

No acute large vessel territory infarct. No intracranial hemorrhage.
No mass lesion, midline shift, or mass effect. No hydrocephalus. No
extra-axial fluid collection.

Dedicated post-contrast images demonstrate no abnormal enhancement.
No filling defect to suggest dural venous thrombosis. Left
transverse sinus is hypoplastic. Deep veins including the straight
sinus, vein of Hadir, basal vein of Pakiam, and inferior sagittal
sinus appear patent.

Scalp soft tissues within normal limits. No acute abnormality about
the orbits.

Calvarium intact. Paranasal sinuses and mastoid air cells are well
pneumatized and free of fluid.
IMPRESSION: Normal head CT with no acute intracranial process identified. No
evidence for venous thrombosis.

## 2018-02-19 ENCOUNTER — Encounter (HOSPITAL_BASED_OUTPATIENT_CLINIC_OR_DEPARTMENT_OTHER): Payer: Self-pay | Admitting: *Deleted

## 2018-02-19 ENCOUNTER — Emergency Department (HOSPITAL_BASED_OUTPATIENT_CLINIC_OR_DEPARTMENT_OTHER): Payer: 59

## 2018-02-19 ENCOUNTER — Emergency Department (HOSPITAL_BASED_OUTPATIENT_CLINIC_OR_DEPARTMENT_OTHER)
Admission: EM | Admit: 2018-02-19 | Discharge: 2018-02-19 | Disposition: A | Discharge status: Home / Self Care | Payer: 59 | Attending: Emergency Medicine | Admitting: Emergency Medicine

## 2018-02-19 ENCOUNTER — Other Ambulatory Visit: Payer: Self-pay

## 2018-02-19 DIAGNOSIS — R109 Unspecified abdominal pain: Secondary | ICD-10-CM

## 2018-02-19 DIAGNOSIS — R202 Paresthesia of skin: Secondary | ICD-10-CM | POA: Diagnosis not present

## 2018-02-19 DIAGNOSIS — R51 Headache: Secondary | ICD-10-CM | POA: Diagnosis not present

## 2018-02-19 DIAGNOSIS — R1031 Right lower quadrant pain: Secondary | ICD-10-CM | POA: Diagnosis present

## 2018-02-19 DIAGNOSIS — R42 Dizziness and giddiness: Secondary | ICD-10-CM

## 2018-02-19 LAB — CBC WITH DIFFERENTIAL/PLATELET
Abs Immature Granulocytes: 0.02 10*3/uL (ref 0.00–0.07)
BASOS ABS: 0 10*3/uL (ref 0.0–0.1)
BASOS PCT: 1 %
Eosinophils Absolute: 0.1 10*3/uL (ref 0.0–0.5)
Eosinophils Relative: 2 %
HCT: 42.9 % (ref 36.0–46.0)
Hemoglobin: 14.6 g/dL (ref 12.0–15.0)
Immature Granulocytes: 0 %
LYMPHS ABS: 2.5 10*3/uL (ref 0.7–4.0)
Lymphocytes Relative: 30 %
MCH: 29.6 pg (ref 26.0–34.0)
MCHC: 34 g/dL (ref 30.0–36.0)
MCV: 87 fL (ref 80.0–100.0)
Monocytes Absolute: 0.5 10*3/uL (ref 0.1–1.0)
Monocytes Relative: 7 %
Neutro Abs: 5.1 10*3/uL (ref 1.7–7.7)
Neutrophils Relative %: 60 %
Platelets: 321 10*3/uL (ref 150–400)
RBC: 4.93 MIL/uL (ref 3.87–5.11)
RDW: 12.1 % (ref 11.5–15.5)
WBC: 8.3 10*3/uL (ref 4.0–10.5)
nRBC: 0 % (ref 0.0–0.2)

## 2018-02-19 LAB — COMPREHENSIVE METABOLIC PANEL
ALT: 41 U/L (ref 0–44)
AST: 37 U/L (ref 15–41)
Albumin: 4.2 g/dL (ref 3.5–5.0)
Alkaline Phosphatase: 52 U/L (ref 38–126)
Anion gap: 11 (ref 5–15)
BUN: 12 mg/dL (ref 6–20)
CO2: 26 mmol/L (ref 22–32)
CREATININE: 0.83 mg/dL (ref 0.44–1.00)
Calcium: 9.2 mg/dL (ref 8.9–10.3)
Chloride: 101 mmol/L (ref 98–111)
GFR calc non Af Amer: >60 mL/min (ref >60)
Glucose, Bld: 144 mg/dL — ABNORMAL HIGH (ref 70–99)
Potassium: 3.8 mmol/L (ref 3.5–5.1)
Sodium: 138 mmol/L (ref 135–145)
Total Bilirubin: 0.8 mg/dL (ref 0.3–1.2)
Total Protein: 7.6 g/dL (ref 6.5–8.1)

## 2018-02-19 LAB — URINALYSIS, ROUTINE W REFLEX MICROSCOPIC
Bilirubin Urine: NEGATIVE
Glucose, UA: NEGATIVE mg/dL
Ketones, ur: 40 mg/dL — AB
Leukocytes, UA: NEGATIVE
NITRITE: NEGATIVE
Protein, ur: NEGATIVE mg/dL
Specific Gravity, Urine: 1.02 (ref 1.005–1.030)
pH: 6.5 (ref 5.0–8.0)

## 2018-02-19 LAB — URINALYSIS, MICROSCOPIC (REFLEX)

## 2018-02-19 LAB — LIPASE, BLOOD: LIPASE: 34 U/L (ref 11–51)

## 2018-02-19 LAB — TROPONIN I: Troponin I: <0.03 ng/mL (ref <0.03)

## 2018-02-19 LAB — CBG MONITORING, ED: GLUCOSE-CAPILLARY: 118 mg/dL — AB (ref 70–99)

## 2018-02-19 LAB — PREGNANCY, URINE: Preg Test, Ur: NEGATIVE

## 2018-02-19 MED ORDER — MECLIZINE HCL 25 MG PO TABS: 25.0000 mg | ORAL_TABLET | Freq: Three times a day (TID) | ORAL | 0 refills | Status: DC | PRN

## 2018-02-19 MED ORDER — MECLIZINE HCL 25 MG PO TABS
25.0000 mg | ORAL_TABLET | Freq: Once | ORAL | Status: AC
  Administered 2018-02-19: 25 mg via ORAL
  Filled 2018-02-19: qty 1

## 2018-02-19 MED ORDER — IOPAMIDOL (ISOVUE-370) INJECTION 76%
100.0000 mL | Freq: Once | INTRAVENOUS | Status: AC | PRN
  Administered 2018-02-19: 80 mL via INTRAVENOUS

## 2018-02-19 NOTE — ED Provider Notes (Signed)
42 yo F with multiple complaints.  I received the patient in signout by Dr. Dalene Seltzer.  Briefly patient is a 42 year old female with a chief complaint of sudden onset right lower quadrant abdominal pain with radiation to the back.  This was sudden and severe and associated with diffuse paresthesias to the arms and the face.  The patient also had an episode of dizziness yesterday was complaining of some persistent dizziness today.  She is also had episodic chest pain and is concerned because her father had his first MI in his 45s.  The plan is to obtain a laboratory evaluation which was unremarkable.  After which the patient is getting a CT angiogram of the head and neck as well as a CT of the abdomen pelvis with contrast.  If these are negative the plan is to obtain a pelvic ultrasound to evaluate for ovarian torsion.  CT negative.  [place] negative for torsion. Patient has been symptom free since I have assumed care.  Ambulates without difficulty.  PCP follow up.    Melene Plan, DO 02/19/18 2041

## 2018-02-19 NOTE — ED Provider Notes (Signed)
[facility] EMERGENCY DEPARTMENT Provider Note   CSN: [id number] Arrival date & time: 02/19/18  1314     History   Chief Complaint Chief Complaint  Patient presents with  . Numbness    HPI Victoria Cunningham is a 42 y.o. female.  HPI  Right sided abdominal pain, a few hours ago then became more severe at 1230PM.  Dizzy last night, felt like had "heat in my brain" then abdominal pain started and worsened and was on the way here when developed numbness, both sides had hand numbness, entire tongue felt numb, left side of face. No focal weakness.  No difficulty walking.   Hx of 2 ocular migraines.  IUD. Hx of ovarian cyst with rupture.  Constant right sided pain, tried to change positions without relief, pain radiating to lower back and vaginally.   Dizziness started last night iwht headache, feels like room spinning. Continuing to have these symptoms.  Trouble walking only because of the dizziness. Did not have other neuro symptoms last night, burning headache.  Does report chronic difficulty with fine movements/coordination of her left hand for one month, feels she develops tremors. She has been able to play piano and feels symptoms are ok while she does this.  Had continuing dizziness when she developed the abdominal pain today.  Right sided, sharp, hx as above.       Past Medical History:  Diagnosis Date  . Gestational diabetes   . No pertinent past medical history   . PONV (postoperative nausea and vomiting)     Patient Active Problem List   Diagnosis Date Noted  . Indication for care in labor and delivery, antepartum 08/15/2014  . Vaginal bleeding in pregnancy   . [redacted] weeks gestation of pregnancy     Past Surgical History:  Procedure Laterality Date  . lapriscopic     2009 r/o endometriosis     OB History    Gravida  4   Para  4   Term  4   Preterm  0   AB  0   Living  4     SAB  0   TAB  0   Ectopic  0   Multiple  0   Live Births  4              Home Medications    Prior to Admission medications   Medication Sig Start Date End Date Taking? Authorizing Provider  ibuprofen (ADVIL,MOTRIN) 800 MG tablet Take 1 tablet (800 mg total) by mouth every 8 (eight) hours as needed for moderate pain. 08/17/14   Julio Sicks, NP  Prenatal Vit-Fe Fumarate-FA (PRENATAL MULTIVITAMIN) TABS Take 1 tablet by mouth at bedtime.     [provider]    Family History Family History  Problem Relation Age of Onset  . Heart disease Father   . Diabetes Father   . Cancer Father        leaukemia  . Cancer Maternal Grandfather   . Cancer Paternal Grandfather   . Anesthesia problems Neg Hx     Social History Social History   Tobacco Use  . Smoking status: Never Smoker  . Smokeless tobacco: Never Used  Substance Use Topics  . Alcohol use: No  . Drug use: No     Allergies   Patient has no known allergies.   Review of Systems Review of Systems  Constitutional: Negative for fever.  HENT: Negative for sore throat.   Eyes: Negative for visual  disturbance.  Respiratory: Negative for cough and shortness of breath.   Cardiovascular: Negative for chest pain.  Gastrointestinal: Positive for abdominal pain and nausea. Negative for constipation, diarrhea and vomiting.  Genitourinary: Negative for difficulty urinating, vaginal bleeding and vaginal pain.  Musculoskeletal: Negative for back pain and neck pain.  Skin: Negative for rash.  Neurological: Positive for numbness and headaches. Negative for syncope, facial asymmetry and weakness.     Physical Exam Updated Vital Signs BP 107/63   Pulse 76   Temp 98.4 F (36.9 C) (Oral)   Resp 16   LMP  (LMP Unknown)   SpO2 100%   Physical Exam  Constitutional: She is oriented to person, place, and time. She appears well-developed and well-nourished. No distress.  HENT:  Head: Normocephalic and atraumatic.  Eyes: Conjunctivae and EOM are normal.  Neck: Normal range of motion.   Cardiovascular: Normal rate, regular rhythm, normal heart sounds and intact distal pulses. Exam reveals no gallop and no friction rub.  No murmur heard. Diminished DP pulse LLE, chronic per pt  Pulmonary/Chest: Effort normal and breath sounds normal. No respiratory distress. She has no wheezes. She has no rales.  Abdominal: Soft. She exhibits no distension. There is tenderness in the right lower quadrant. There is no guarding.  Musculoskeletal: She exhibits no edema or tenderness.  Neurological: She is alert and oriented to person, place, and time. She has normal strength. No cranial nerve deficit or sensory deficit. Coordination normal. GCS eye subscore is 4. GCS verbal subscore is 5. GCS motor subscore is 6.  Skin: Skin is warm and dry. No rash noted. She is not diaphoretic. No erythema.  Nursing note and vitals reviewed.    ED Treatments / Results  Labs (all labs ordered are listed, but only abnormal results are displayed) Labs Reviewed  COMPREHENSIVE METABOLIC PANEL - Abnormal; Notable for the following components:      Result Value   Glucose, Bld 144 (*)    All other components within normal limits  URINALYSIS, ROUTINE W REFLEX MICROSCOPIC - Abnormal; Notable for the following components:   Hgb urine dipstick MODERATE (*)    Ketones, ur 40 (*)    All other components within normal limits  URINALYSIS, MICROSCOPIC (REFLEX) - Abnormal; Notable for the following components:   Bacteria, UA RARE (*)    All other components within normal limits  CBG MONITORING, ED - Abnormal; Notable for the following components:   Glucose-Capillary 118 (*)    All other components within normal limits  TROPONIN I  CBC WITH DIFFERENTIAL/PLATELET  PREGNANCY, URINE  LIPASE, BLOOD    EKG EKG Interpretation  Date/Time:  Tuesday February 19 2018 13:18:59 EDT Ventricular Rate:  90 PR Interval:    QRS Duration: 99 QT Interval:  378 QTC Calculation: 463 R Axis:   75 Text Interpretation:  Sinus  rhythm Minimal ST depression, diffuse leads No previous ECGs available Confirmed by Alvira Monday ([id number]) on 02/19/2018 2:05:41 PM   Radiology Dg Chest 2 View  Result Date: 02/19/2018 CLINICAL DATA:  Chest pain EXAM: CHEST - 2 VIEW COMPARISON:  None. FINDINGS: Lungs are clear. Heart size and pulmonary vascularity are normal. No adenopathy. No pneumothorax. No bone lesions. IMPRESSION: No edema or consolidation. Electronically Signed   By: Bretta Bang III M.D.   On: 02/19/2018 14:25    Procedures Procedures (including critical care time)  Medications Ordered in ED Medications  meclizine (ANTIVERT) tablet 25 mg (has no administration in time range)  Initial Impression / Assessment and Plan / ED Course  I have reviewed the triage vital signs and the nursing notes.  Pertinent labs & imaging results that were available during my care of the patient were reviewed by me and considered in my medical decision making (see chart for details).     42 year old female with a history of gestational diabetes presents with multiple concerns, including concern for headache and dizziness present last night, with severe right-sided abdominal pain beginning today, and sensation of numbness in route to the ED.  Regarding patient's headache and dizziness, will order CTA head and neck to evaluate for possible vertebral dissection, other vascular etiology of headache/post circulation symptoms.   While she describes some chronic difficulty with coordination of the left hand, she has normal finger to nose on exam, no signs of acute CVA, some atypical neurologic symptoms (bilateral tongue and hand numbness), and if she can ambulate after meclizine and has normal scan, she is appropriate for outpatient PCP evaluation with likely peripheral etiology of vertigo.   Regarding her abdominal pain--CT abdomen pelvis pending. Urinalysis without infection. Pregnancy test negative. If CT negative, will check [place] for  possible ovarian torsion.    On ROS pt reports occasional achy chest pain, not assocaited with exertion, not current now.  Troponin negative. Not stabbing in nature, not classic for dissection. XR without acute abnormaliteis.   Signed out to Dr. Adela Lank with CT pending, plan for ambulation.    Final Clinical Impressions(s) / ED Diagnoses   Final diagnoses:  Vertigo  Right sided abdominal pain    ED Discharge Orders    None       Alvira Monday, MD 02/19/18 1529

## 2018-02-19 NOTE — ED Notes (Addendum)
Pt noted to be hyperventilating with bilateral hand cramps. Able to coach patient into slowing breaths. Had pt hold NRB mask to face on R/A for 1 minute. Noted a release in hand cramps and pt reports improvement.   Pt states last PM at 20:30 she had a sudden onset of dizziness when she went from sitting to standing. Pt reports that the dizziness has waxed and waned and is worse with head movements. Today pt had a sudden onset of RLQ pain that radiated to back with associated nausea. During drive to hospital pt had sudden onset of tingling around mouth and in both hands along with bilateral hand cramps that has now resolved.   BEFAST neuro exam was negative.

## 2018-02-19 NOTE — Discharge Instructions (Signed)
As discussed, please return for worsening abdominal pain, fever, inability to eat or drink.  Inability to walk. Worsening dizziness.

## 2018-02-19 NOTE — ED Notes (Signed)
ED Provider at bedside. 

## 2018-02-19 NOTE — ED Notes (Signed)
Pt on monitor 

## 2018-02-19 NOTE — ED Triage Notes (Signed)
Pt was having cramps really bad, then her hands, face and left side are numb. Pt stated that she had a headache and dizziness last night.

## 2018-02-19 NOTE — ED Notes (Signed)
Patient transported to Ultrasound 

## 2019-02-28 ENCOUNTER — Other Ambulatory Visit: Payer: Self-pay

## 2019-02-28 DIAGNOSIS — Z20822 Contact with and (suspected) exposure to covid-19: Secondary | ICD-10-CM

## 2019-03-02 LAB — NOVEL CORONAVIRUS, NAA: SARS-CoV-2, NAA: NOT DETECTED

## 2019-08-29 IMAGING — CR DG CHEST 2V
2 series · 2 of 2 positions shown · non-contrast
Comparison: None.

CLINICAL DATA: Chest pain

EXAM:
CHEST - 2 VIEW

[w chest ap]
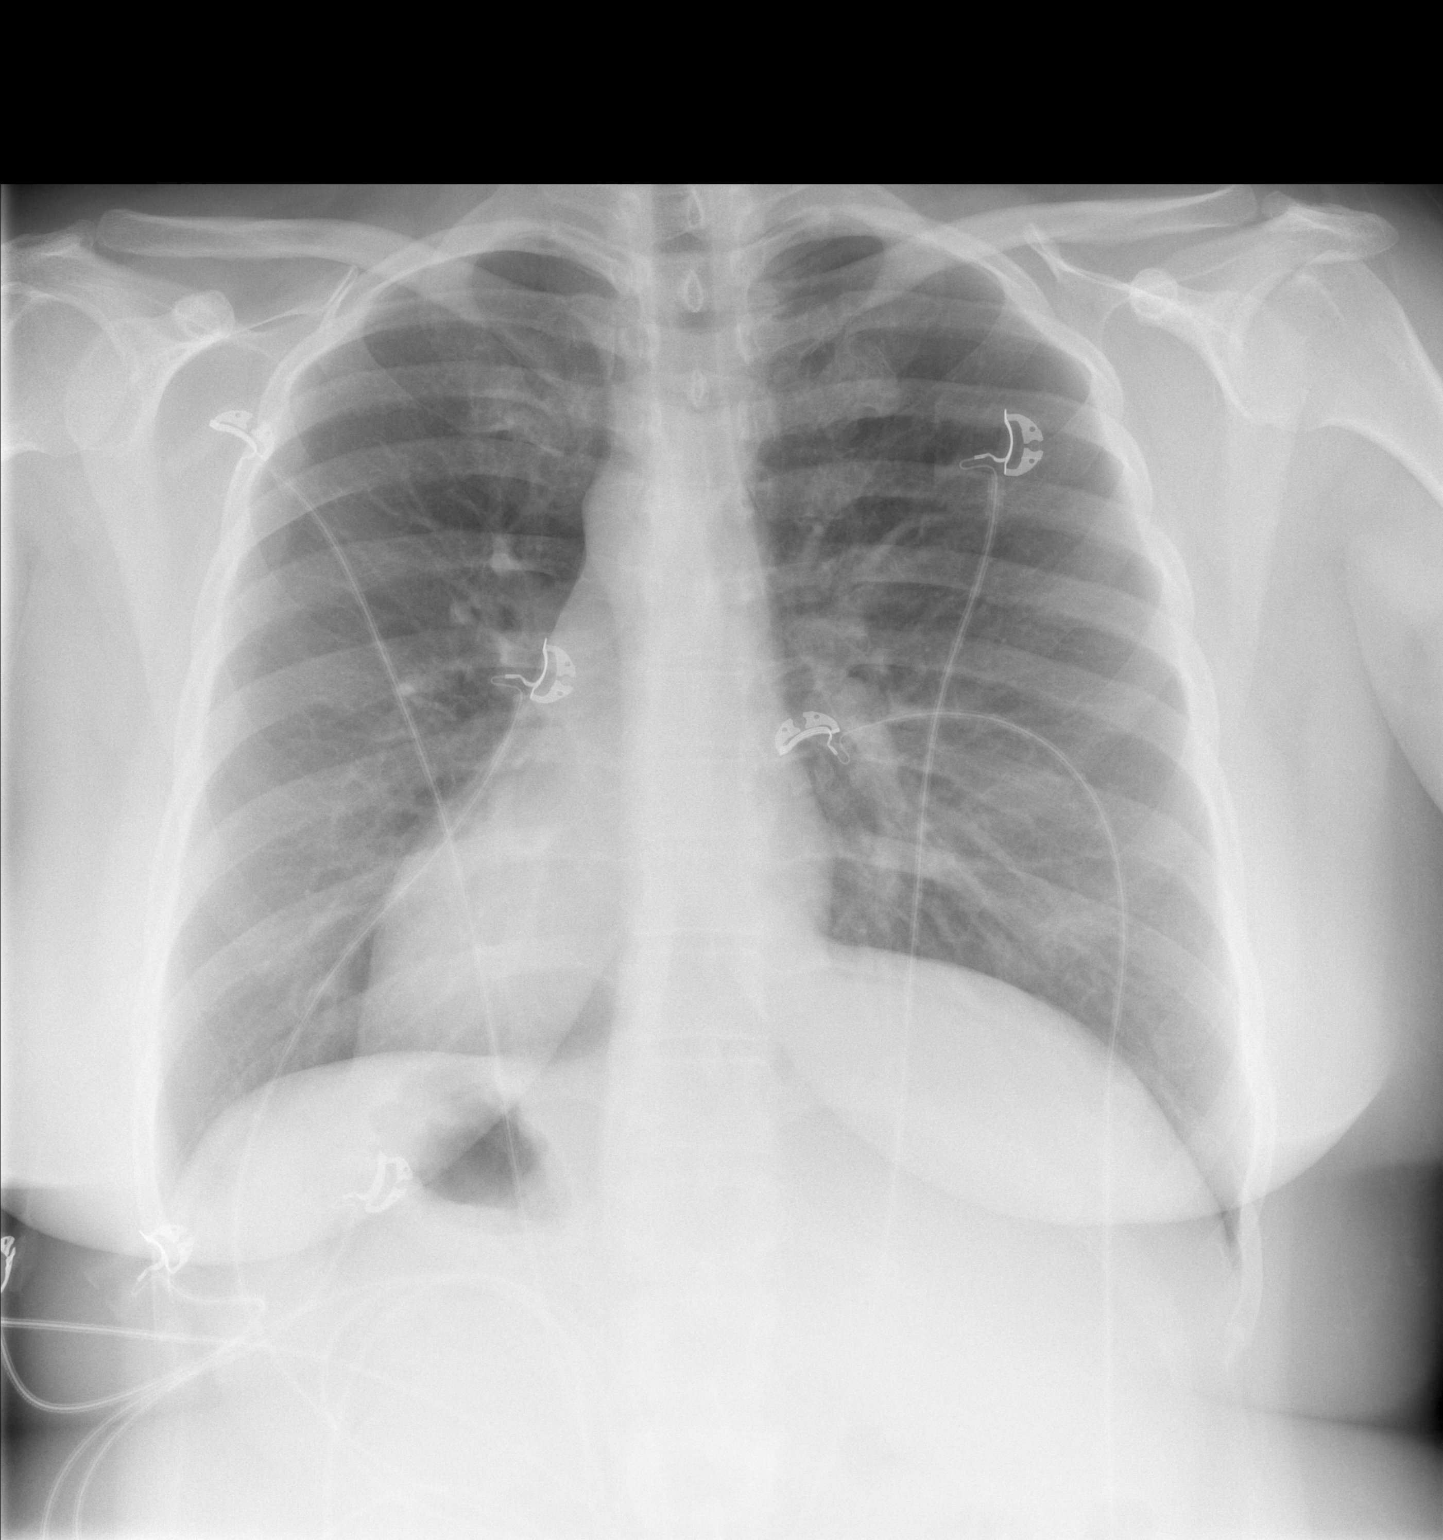

[w chest lat]
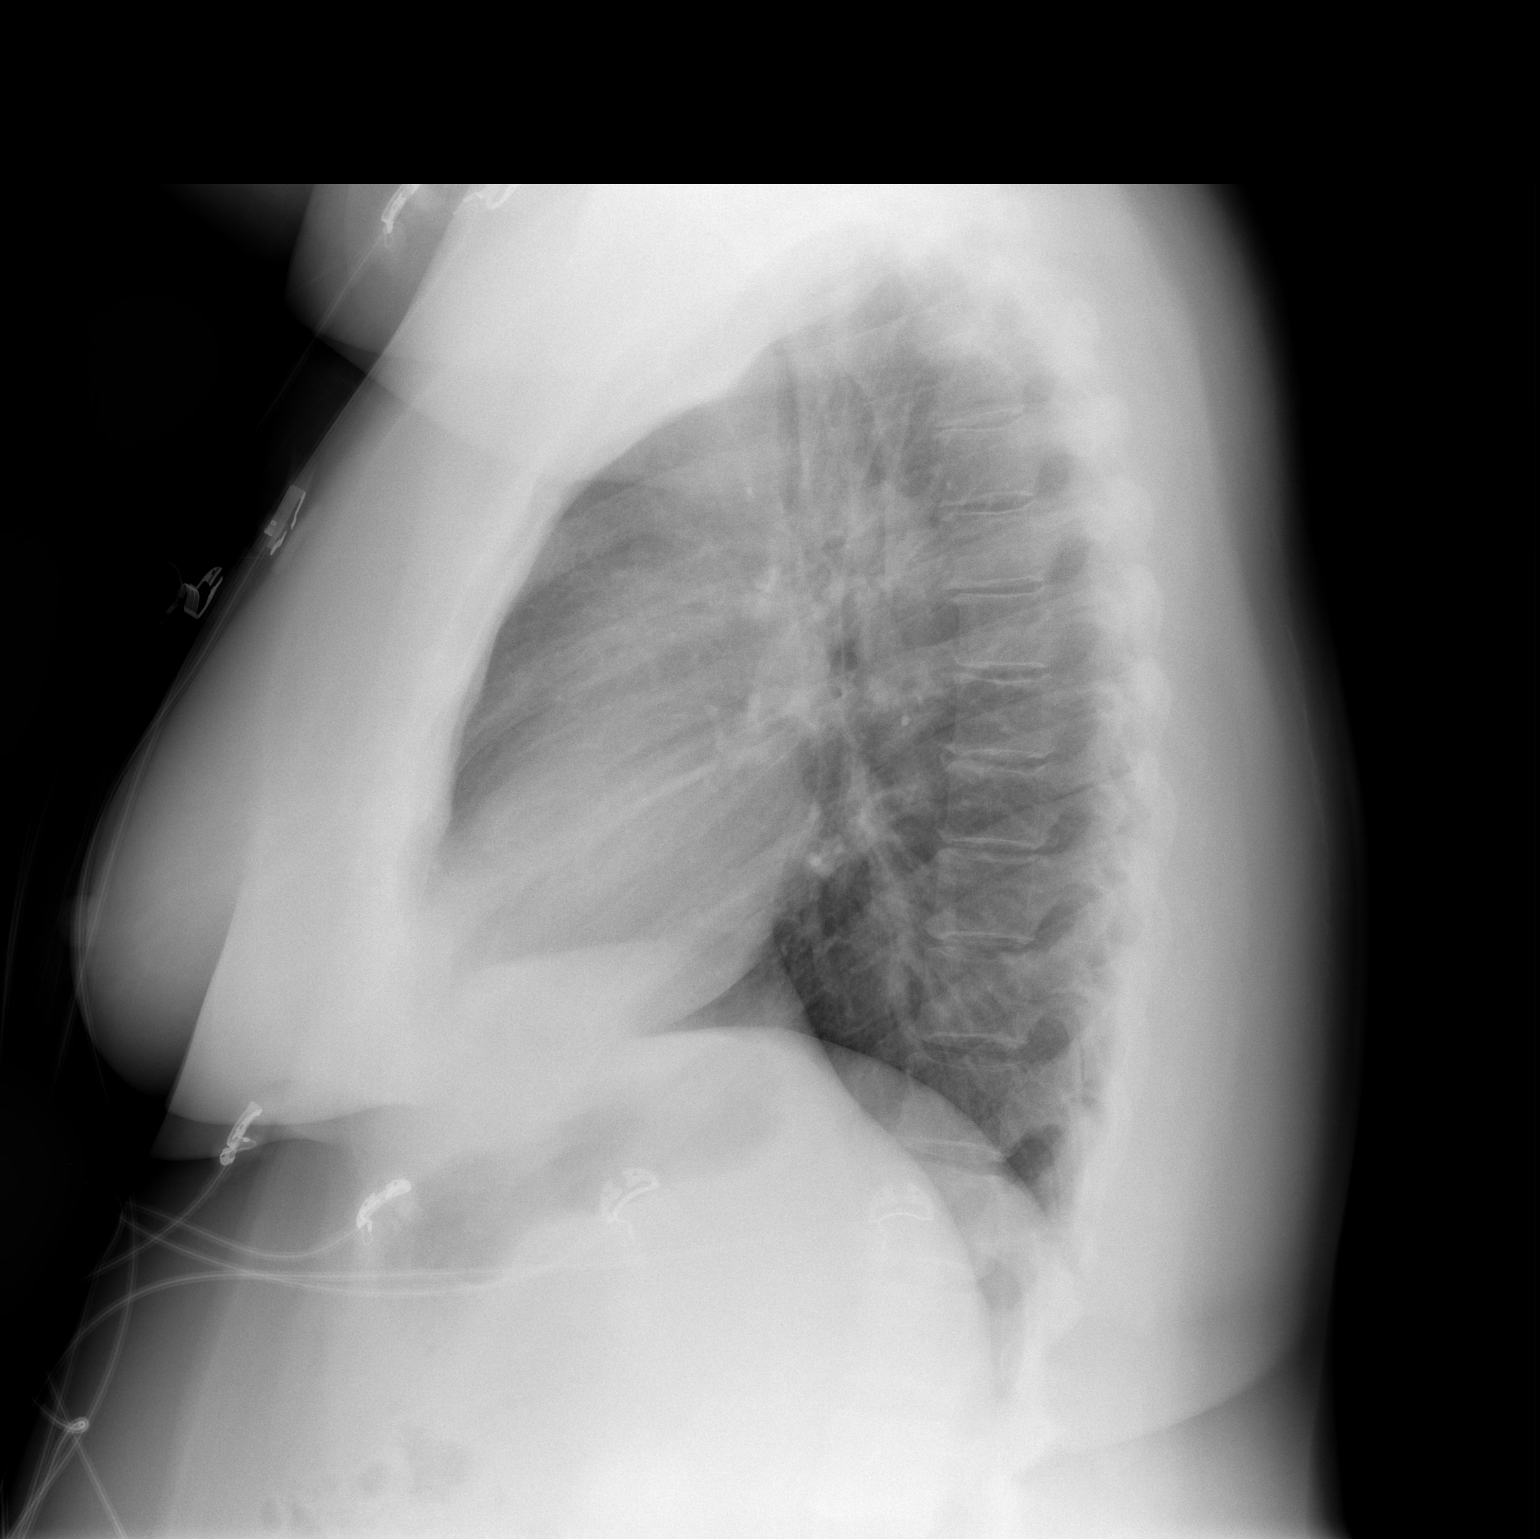

[2 of 2 positions shown; findings below may reference images not displayed]

FINDINGS: Lungs are clear. Heart size and pulmonary vascularity are normal. No
adenopathy. No pneumothorax. No bone lesions.
IMPRESSION: No edema or consolidation.

## 2020-11-03 ENCOUNTER — Encounter (HOSPITAL_BASED_OUTPATIENT_CLINIC_OR_DEPARTMENT_OTHER): Payer: Self-pay

## 2020-11-03 ENCOUNTER — Emergency Department (HOSPITAL_BASED_OUTPATIENT_CLINIC_OR_DEPARTMENT_OTHER)
Admission: EM | Admit: 2020-11-03 | Discharge: 2020-11-03 | Disposition: A | Discharge status: Home / Self Care | Payer: 59 | Attending: Emergency Medicine | Admitting: Emergency Medicine

## 2020-11-03 ENCOUNTER — Emergency Department (HOSPITAL_BASED_OUTPATIENT_CLINIC_OR_DEPARTMENT_OTHER): Payer: 59

## 2020-11-03 ENCOUNTER — Other Ambulatory Visit: Payer: Self-pay

## 2020-11-03 DIAGNOSIS — W228XXA Striking against or struck by other objects, initial encounter: Secondary | ICD-10-CM | POA: Diagnosis not present

## 2020-11-03 DIAGNOSIS — S91012A Laceration without foreign body, left ankle, initial encounter: Secondary | ICD-10-CM | POA: Diagnosis not present

## 2020-11-03 MED ORDER — LIDOCAINE-EPINEPHRINE (PF) 2 %-1:200000 IJ SOLN
10.0000 mL | Freq: Once | INTRAMUSCULAR | Status: AC
  Administered 2020-11-03: 10 mL
  Filled 2020-11-03: qty 20

## 2020-11-03 NOTE — Discharge Instructions (Addendum)
1. Medications: Tylenol or ibuprofen for pain, usual home medications 2. Treatment: ice for swelling, keep wound clean with warm soap and water and keep bandage dr 3. Follow Up: Please return in 10-14 days to have your stitches removed or sooner if you have concerns. Return to the emergency department for increased redness, drainage of pus from the wound   WOUND CARE  Remove bandage and wash wound gently with mild soap and warm water. Reapply a new bandage after cleaning wound as needed  Continue daily cleansing with soap and water until stitches are removed.  Do not apply any ointments or creams to the wound while stitches are in place, as this may cause delayed healing. Return if you experience any of the following signs of infection: Swelling, redness, pus drainage, streaking, fever >101.0 F  Return if you experience excessive bleeding that does not stop after 15-20 minutes of constant, firm pressure.

## 2020-11-03 NOTE — ED Triage Notes (Addendum)
Pt hit outer left ankle on metal doorframe of glass door. Deep laceration noted. Bleeding controlled during triage. Last tetanus 6 years ago.

## 2020-11-03 NOTE — ED Provider Notes (Signed)
[facility] EMERGENCY DEPARTMENT Provider Note   CSN: [id number] Arrival date & time: 11/03/20  1732     History Chief Complaint  Patient presents with   Laceration    Victoria Cunningham is a 45 y.o. female presenting for evaluation of ankle laceration.  Patient states just prior to arrival she accidentally hit her left outer ankle against a new glass door.  She reports mild pain at the area.  She has been able to ambulate since.  No numbness or tingling.  Tetanus was updated 6 years ago.  No injury elsewhere.  She is not immunocompromised.  Not on blood thinners  HPI     Past Medical History:  Diagnosis Date   Gestational diabetes    No pertinent past medical history    PONV (postoperative nausea and vomiting)     Patient Active Problem List   Diagnosis Date Noted   Indication for care in labor and delivery, antepartum 08/15/2014   Vaginal bleeding in pregnancy    [redacted] weeks gestation of pregnancy     Past Surgical History:  Procedure Laterality Date   lapriscopic     2009 r/o endometriosis     OB History     Gravida  4   Para  4   Term  4   Preterm  0   AB  0   Living  4      SAB  0   IAB  0   Ectopic  0   Multiple  0   Live Births  4           Family History  Problem Relation Age of Onset   Heart disease Father    Diabetes Father    Cancer Father        leaukemia   Cancer Maternal Grandfather    Cancer Paternal Grandfather    Anesthesia problems Neg Hx     Social History   Tobacco Use   Smoking status: Never   Smokeless tobacco: Never  Substance Use Topics   Alcohol use: No   Drug use: No    Home Medications Prior to Admission medications   Medication Sig Start Date End Date Taking? Authorizing Provider  ibuprofen (ADVIL,MOTRIN) 800 MG tablet Take 1 tablet (800 mg total) by mouth every 8 (eight) hours as needed for moderate pain. 08/17/14   Julio Sicks, NP  meclizine (ANTIVERT) 25 MG tablet Take 1 tablet (25 mg  total) by mouth 3 (three) times daily as needed for dizziness. 02/19/18   Melene Plan, DO  Prenatal Vit-Fe Fumarate-FA (PRENATAL MULTIVITAMIN) TABS Take 1 tablet by mouth at bedtime.     [provider]    Allergies    Patient has no known allergies.  Review of Systems   Review of Systems  Skin:  Positive for wound.  Neurological:  Negative for numbness.  Hematological:  Does not bruise/bleed easily.   Physical Exam Updated Vital Signs BP (!) 138/93 (BP Location: Right Arm)   Pulse 80   Temp 98.3 F (36.8 C) (Oral)   Resp 18   Ht 5' 2.5" (1.588 m)   Wt 104.3 kg   SpO2 100%   BMI 41.40 kg/m   Physical Exam Vitals and nursing note reviewed.  Constitutional:      General: She is not in acute distress.    Appearance: She is well-developed.  HENT:     Head: Normocephalic and atraumatic.  Eyes:     Extraocular Movements: Extraocular  movements intact.  Cardiovascular:     Rate and Rhythm: Normal rate.  Pulmonary:     Effort: Pulmonary effort is normal.  Abdominal:     General: There is no distension.  Musculoskeletal:        General: Normal range of motion.     Cervical back: Normal range of motion.     Comments: 3 centimeter laceration of the left ankle vertically between the lateral malleolus and the Achilles tendon.  Surrounding tenderness palpation, although no pain over the protuberance of the lateral malleolus.  Achilles tendon is palpable and intact.  Full active range of motion ankle with mild discomfort.  Skin:    General: Skin is warm.     Capillary Refill: Capillary refill takes less than 2 seconds.     Findings: No rash.  Neurological:     Mental Status: She is alert and oriented to person, place, and time.    ED Results / Procedures / Treatments   Labs (all labs ordered are listed, but only abnormal results are displayed) Labs Reviewed - No data to display  EKG None  Radiology DG Ankle Complete Left  Result Date: 11/03/2020 CLINICAL DATA:   Per pt Pt hit outer left ankle on metal doorframe of glass door. lac EXAM: LEFT ANKLE COMPLETE - 3+ VIEW COMPARISON:  None. FINDINGS: Ankle mortise intact. The talar dome is normal. No malleolar fracture. The calcaneus is normal. IMPRESSION: No fracture or dislocation. Electronically Signed   By: Genevive Bi M.D.   On: 11/03/2020 18:29    Procedures .Marland KitchenLaceration Repair  Date/Time: 11/03/2020 7:07 PM Performed by: Alveria Apley, PA-C Authorized by: Alveria Apley, PA-C   Consent:    Consent obtained:  Verbal   Consent given by:  Patient   Risks, benefits, and alternatives were discussed: yes     Risks discussed:  Infection, pain, poor cosmetic result, poor wound healing, need for additional repair and nerve damage Anesthesia:    Anesthesia method:  Local infiltration   Local anesthetic:  Lidocaine 2% WITH epi Laceration details:    Location:  Foot   Foot location:  L ankle   Length (cm):  3   Depth (mm):  2 Pre-procedure details:    Preparation:  Patient was prepped and draped in usual sterile fashion and imaging obtained to evaluate for foreign bodies Exploration:    Hemostasis achieved with:  Direct pressure   Wound exploration: wound explored through full range of motion and entire depth of wound visualized     Wound extent: no foreign bodies/material noted, no muscle damage noted, no nerve damage noted, no tendon damage noted and no underlying fracture noted   Treatment:    Area cleansed with:  Soap and water   Amount of cleaning:  Standard   Irrigation solution:  Sterile water   Irrigation method:  Syringe Skin repair:    Repair method:  Sutures   Suture size:  4-0   Suture material:  Prolene   Suture technique:  Simple interrupted   Number of sutures:  5 Approximation:    Approximation:  Close Repair type:    Repair type:  Simple Post-procedure details:    Dressing:  Sterile dressing   Procedure completion:  Tolerated well, no immediate complications    Medications Ordered in ED Medications  lidocaine-EPINEPHrine (XYLOCAINE W/EPI) 2 %-1:200000 (PF) injection 10 mL (has no administration in time range)    ED Course  I have reviewed the triage vital signs and the nursing notes.  Pertinent labs & imaging results that were available during my care of the patient were reviewed by me and considered in my medical decision making (see chart for details).    MDM Rules/Calculators/A&P                          Patient presented for evaluation of ankle laceration.  On exam, patient appears nontoxic.  She is neurovascular intact.  She does have some surrounding tenderness, will obtain x-rays to ensure no bony abnormality.  X-rays viewed and independently interpreted by me, no fracture or dislocation.  Laceration repaired as described above.  Discussed aftercare instructions.  At this time, patient appears safe for discharge.  Return precautions given.  Patient states she understands and agrees to plan.   Final Clinical Impression(s) / ED Diagnoses Final diagnoses:  Laceration of left ankle, initial encounter    Rx / DC Orders ED Discharge Orders     None        Alveria Apley, PA-C 11/03/20 1909    Virgina Norfolk, DO 11/03/20 2304

## 2021-03-11 ENCOUNTER — Emergency Department (HOSPITAL_BASED_OUTPATIENT_CLINIC_OR_DEPARTMENT_OTHER): Payer: 59

## 2021-03-11 ENCOUNTER — Encounter (HOSPITAL_BASED_OUTPATIENT_CLINIC_OR_DEPARTMENT_OTHER): Payer: Self-pay | Admitting: *Deleted

## 2021-03-11 ENCOUNTER — Emergency Department (HOSPITAL_BASED_OUTPATIENT_CLINIC_OR_DEPARTMENT_OTHER)
Admission: EM | Admit: 2021-03-11 | Discharge: 2021-03-11 | Disposition: A | Discharge status: Home / Self Care | Payer: 59 | Attending: Emergency Medicine | Admitting: Emergency Medicine

## 2021-03-11 ENCOUNTER — Other Ambulatory Visit: Payer: Self-pay

## 2021-03-11 DIAGNOSIS — M79605 Pain in left leg: Secondary | ICD-10-CM | POA: Insufficient documentation

## 2021-03-11 DIAGNOSIS — M25562 Pain in left knee: Secondary | ICD-10-CM | POA: Diagnosis not present

## 2021-03-11 MED ORDER — OXYCODONE HCL 5 MG PO TABS: 5.0000 mg | ORAL_TABLET | Freq: Four times a day (QID) | ORAL | 0 refills | Status: AC | PRN

## 2021-03-11 NOTE — ED Triage Notes (Signed)
Pain behind her left knee for months. The pain has gotten worse with time. She saw a doctor this week and was given Mobic for the pain. She felt a pop today and now the pain is worse.

## 2021-03-11 NOTE — ED Provider Notes (Signed)
[facility] EMERGENCY DEPARTMENT Provider Note   CSN: [id number] Arrival date & time: 03/11/21  1324     History Chief Complaint  Patient presents with   Leg Pain    Victoria Cunningham is a 45 y.o. female.  45 year old female presents today for evaluation of left posterior knee pain of 84-month duration that acutely worsened today while she was ambulating across campus.  Patient reports while she was ambulating she heard a pop and then a sudden worsening of her previous pain.  She reports over the past couple months she has been evaluated by her PCP who prescribed her meloxicam before further imaging or referral to orthopedics was pursued.  Patient reports meloxicam did not provide her with any relief.  She reports that her left lower extremity is typically bigger in size compared to her right and her pulses are faint compared to the right.  She reports that the incident today been difficult for her to bear weight on her left leg.  She denies recent prolonged flight, or road trip.  The history is provided by the patient. No language interpreter was used.      Past Medical History:  Diagnosis Date   No pertinent past medical history    PONV (postoperative nausea and vomiting)     Patient Active Problem List   Diagnosis Date Noted   Indication for care in labor and delivery, antepartum 08/15/2014   Vaginal bleeding in pregnancy    [redacted] weeks gestation of pregnancy     Past Surgical History:  Procedure Laterality Date   lapriscopic     2009 r/o endometriosis     OB History     Gravida  4   Para  4   Term  4   Preterm  0   AB  0   Living  4      SAB  0   IAB  0   Ectopic  0   Multiple  0   Live Births  4           Family History  Problem Relation Age of Onset   Heart disease Father    Diabetes Father    Cancer Father        leaukemia   Cancer Maternal Grandfather    Cancer Paternal Grandfather    Anesthesia problems Neg Hx     Social  History   Tobacco Use   Smoking status: Never   Smokeless tobacco: Never  Vaping Use   Vaping Use: Never used  Substance Use Topics   Alcohol use: No   Drug use: No    Home Medications Prior to Admission medications   Medication Sig Start Date End Date Taking? Authorizing Provider  meloxicam (MOBIC) 7.5 MG tablet Take 7.5 mg by mouth daily.   Yes [provider]  ibuprofen (ADVIL,MOTRIN) 800 MG tablet Take 1 tablet (800 mg total) by mouth every 8 (eight) hours as needed for moderate pain. 08/17/14   Julio Sicks, NP  meclizine (ANTIVERT) 25 MG tablet Take 1 tablet (25 mg total) by mouth 3 (three) times daily as needed for dizziness. 02/19/18   Melene Plan, DO  Prenatal Vit-Fe Fumarate-FA (PRENATAL MULTIVITAMIN) TABS Take 1 tablet by mouth at bedtime.     [provider]    Allergies    Patient has no known allergies.  Review of Systems   Review of Systems  Constitutional:  Positive for activity change. Negative for appetite change, diaphoresis and fever.  Respiratory:  Negative for shortness of breath.   Cardiovascular:  Negative for chest pain.  Musculoskeletal:  Positive for gait problem. Negative for arthralgias and joint swelling.  Neurological:  Negative for weakness.  All other systems reviewed and are negative.  Physical Exam Updated Vital Signs BP (!) 126/91 (BP Location: Left Arm)   Pulse 83   Temp 98.1 F (36.7 C) (Oral)   Resp 18   Ht 5' 2.5" (1.588 m)   Wt 103.9 kg   SpO2 100%   BMI 41.22 kg/m   Physical Exam Vitals and nursing note reviewed.  Constitutional:      General: She is not in acute distress.    Appearance: Normal appearance. She is not ill-appearing.  HENT:     Head: Normocephalic and atraumatic.     Nose: Nose normal.  Eyes:     General: No scleral icterus.    Extraocular Movements: Extraocular movements intact.     Conjunctiva/sclera: Conjunctivae normal.  Cardiovascular:     Rate and Rhythm: Normal rate and regular  rhythm.     Pulses: Normal pulses.     Heart sounds: Normal heart sounds.  Pulmonary:     Effort: Pulmonary effort is normal. No respiratory distress.     Breath sounds: Normal breath sounds. No wheezing or rales.  Abdominal:     General: There is no distension.     Tenderness: There is no abdominal tenderness.  Musculoskeletal:        General: Normal range of motion.     Cervical back: Normal range of motion.     Comments: Patient with significant difficulty and pain moving from sitting to standing.  Without visual deformity to her left knee.  Significant tenderness to palpation present over the posterior aspect of her knee.  Flexion of the knee limited secondary to pain otherwise is without limitations in her range of motion.  Good range of motion in left ankle.  Left ankle and left hip without tenderness to palpation.  Patient has decreased but present dorsalis pedis pulse.  Skin:    General: Skin is warm and dry.  Neurological:     General: No focal deficit present.     Mental Status: She is alert. Mental status is at baseline.    ED Results / Procedures / Treatments   Labs (all labs ordered are listed, but only abnormal results are displayed) Labs Reviewed - No data to display  EKG None  Radiology No results found.  Procedures Procedures   Medications Ordered in ED Medications - No data to display  ED Course  I have reviewed the triage vital signs and the nursing notes.  Pertinent labs & imaging results that were available during my care of the patient were reviewed by me and considered in my medical decision making (see chart for details).    MDM Rules/Calculators/A&P                           45 year old female presents today for evaluation of acute worsening of her 32-month duration of left knee pain earlier today associated with a pop.  Patient has an antalgic gait and significant pain with weightbearing.  X-rays without acute fracture and left lower extremity  ultrasound without DVT or other acute findings.  Given the above findings as well as mechanism associated with pop it is likely that patient has a ligamentous or tendon injury.  We will give patient orthopedic follow-up.  Will  give patient crutches for nonweightbearing until her follow-up.  Patient also given 3 days of oxycodone for breakthrough pain.  Discussed importance of RICE.  Patient voices understanding and is in agreement with plan.  Final Clinical Impression(s) / ED Diagnoses Final diagnoses:  None    Rx / DC Orders ED Discharge Orders     None        Marita Kansas, PA-C 03/11/21 1805    Terald Sleeper, MD 03/12/21 1021

## 2021-03-11 NOTE — Discharge Instructions (Addendum)
Ultrasound of your left leg was without DVT or other abnormalities.  Your knee x-ray was without fracture or other abnormalities.  Given that you heard a pop it is likely that you may have injured a ligament around the knee.  I will send in 3 days of pain medication you can use for breakthrough pain.  Otherwise continue using your meloxicam.  Ice the area for 15 to 20 minutes every 3 hours.  Elevate the leg as you are able.  We also gave you crutches to keep weight off of your left leg.  Dr. Jordan Likes information is listed above give his office a call first thing Monday to get your appointment scheduled.

## 2021-03-15 ENCOUNTER — Encounter: Payer: Self-pay | Admitting: Family Medicine

## 2021-03-15 ENCOUNTER — Ambulatory Visit: Payer: Self-pay

## 2021-03-15 ENCOUNTER — Ambulatory Visit (INDEPENDENT_AMBULATORY_CARE_PROVIDER_SITE_OTHER): Payer: 59 | Admitting: Family Medicine

## 2021-03-15 VITALS — BP 130/98 | Ht 62.5 in | Wt 229.0 lb

## 2021-03-15 DIAGNOSIS — M7122 Synovial cyst of popliteal space [Baker], left knee: Secondary | ICD-10-CM | POA: Diagnosis not present

## 2021-03-15 DIAGNOSIS — M25562 Pain in left knee: Secondary | ICD-10-CM

## 2021-03-15 MED ORDER — PREDNISONE 5 MG PO TABS: ORAL_TABLET | ORAL | 0 refills | Status: DC

## 2021-03-15 NOTE — Progress Notes (Signed)
  Victoria Cunningham - 45 y.o. female MRN [id number]  Date of birth: Oct 10, 1975  SUBJECTIVE:  Including CC & ROS.  No chief complaint on file.   Victoria Cunningham is a 45 y.o. female that is presenting with posterior knee pain.  The pain is been ongoing since June.  She felt a pop on Friday and her symptoms have improved.  Denies any specific injury or inciting event.  It is worse with toeing off and walking..  Independent review of the Doppler study done on 10/28 showed no DVT.  Independent review of the left knee x-ray from 10/28 shows no acute changes.   Review of Systems See HPI   HISTORY: Past Medical, Surgical, Social, and Family History Reviewed & Updated per EMR.   Pertinent Historical Findings include:  Past Medical History:  Diagnosis Date   No pertinent past medical history    PONV (postoperative nausea and vomiting)     Past Surgical History:  Procedure Laterality Date   lapriscopic     2009 r/o endometriosis    Family History  Problem Relation Age of Onset   Heart disease Father    Diabetes Father    Cancer Father        leaukemia   Cancer Maternal Grandfather    Cancer Paternal Grandfather    Anesthesia problems Neg Hx     Social History   Socioeconomic History   Marital status: Married    Spouse name: Not on file   Number of children: Not on file   Years of education: Not on file   Highest education level: Not on file  Occupational History   Not on file  Tobacco Use   Smoking status: Never   Smokeless tobacco: Never  Vaping Use   Vaping Use: Never used  Substance and Sexual Activity   Alcohol use: No   Drug use: No   Sexual activity: Yes    Birth control/protection: None  Other Topics Concern   Not on file  Social History Narrative   Not on file   Social Determinants of Health   Financial Resource Strain: Not on file  Food Insecurity: Not on file  Transportation Needs: Not on file  Physical Activity: Not on file  Stress: Not on file   Social Connections: Not on file  Intimate Partner Violence: Not on file     PHYSICAL EXAM:  VS: BP (!) 130/98 (BP Location: Left Arm, Patient Position: Sitting, Cuff Size: Large)   Ht 5' 2.5" (1.588 m)   Wt 229 lb (103.9 kg)   BMI 41.22 kg/m  Physical Exam Gen: NAD, alert, cooperative with exam, well-appearing   Limited ultrasound: Left knee:  No effusion in suprapatellar pouch. Normal-appearing quadricep and patellar tendon. Mild degenerative changes of the medial and lateral joint space. Small Baker's cyst   Summary: Small Baker's cyst  Ultrasound and interpretation by Clare Gandy, MD    ASSESSMENT & PLAN:   Baker's cyst of knee, left Has gotten improvement recently since the feeling of a popping sensation on Friday.  Likely related to the cyst rupturing a bit with imaging being reassuring.  Less likely for soleus strain at the origin -Counseled on home exercise therapy and supportive care. -Prednisone. -Hinged knee brace. -Could consider physical therapy.

## 2021-03-15 NOTE — Patient Instructions (Signed)
Nice to meet you Please try ice as needed  Please try the exercises  Please try the prednisone   Please send me a message in MyChart with any questions or updates.  Please see me back in 3 weeks.   --Dr. Jordan Likes

## 2021-03-16 DIAGNOSIS — M7122 Synovial cyst of popliteal space [Baker], left knee: Secondary | ICD-10-CM | POA: Insufficient documentation

## 2021-03-16 NOTE — Assessment & Plan Note (Signed)
Has gotten improvement recently since the feeling of a popping sensation on Friday.  Likely related to the cyst rupturing a bit with imaging being reassuring.  Less likely for soleus strain at the origin -Counseled on home exercise therapy and supportive care. -Prednisone. -Hinged knee brace. -Could consider physical therapy.

## 2021-04-05 ENCOUNTER — Encounter: Payer: Self-pay | Admitting: Family Medicine

## 2021-04-05 ENCOUNTER — Ambulatory Visit (INDEPENDENT_AMBULATORY_CARE_PROVIDER_SITE_OTHER): Payer: 59 | Admitting: Family Medicine

## 2021-04-05 DIAGNOSIS — M7122 Synovial cyst of popliteal space [Baker], left knee: Secondary | ICD-10-CM

## 2021-04-05 NOTE — Progress Notes (Signed)
  Victoria Cunningham - 45 y.o. female MRN [id number]  Date of birth: July 15, 1975  SUBJECTIVE:  Including CC & ROS.  No chief complaint on file.   Victoria Cunningham is a 45 y.o. female that is following up for her left knee pain.  She has been doing well and denies any pain today.  She is able to do her normal activities.    Review of Systems See HPI   HISTORY: Past Medical, Surgical, Social, and Family History Reviewed & Updated per EMR.   Pertinent Historical Findings include:  Past Medical History:  Diagnosis Date   No pertinent past medical history    PONV (postoperative nausea and vomiting)     Past Surgical History:  Procedure Laterality Date   lapriscopic     2009 r/o endometriosis    Family History  Problem Relation Age of Onset   Heart disease Father    Diabetes Father    Cancer Father        leaukemia   Cancer Maternal Grandfather    Cancer Paternal Grandfather    Anesthesia problems Neg Hx     Social History   Socioeconomic History   Marital status: Married    Spouse name: Not on file   Number of children: Not on file   Years of education: Not on file   Highest education level: Not on file  Occupational History   Not on file  Tobacco Use   Smoking status: Never   Smokeless tobacco: Never  Vaping Use   Vaping Use: Never used  Substance and Sexual Activity   Alcohol use: No   Drug use: No   Sexual activity: Yes    Birth control/protection: None  Other Topics Concern   Not on file  Social History Narrative   Not on file   Social Determinants of Health   Financial Resource Strain: Not on file  Food Insecurity: Not on file  Transportation Needs: Not on file  Physical Activity: Not on file  Stress: Not on file  Social Connections: Not on file  Intimate Partner Violence: Not on file     PHYSICAL EXAM:  VS: BP 130/80 (BP Location: Left Arm, Patient Position: Sitting)   Ht 5' 2.5" (1.588 m)   Wt 229 lb (103.9 kg)   BMI 41.22 kg/m  Physical  Exam Gen: NAD, alert, cooperative with exam, well-appearing    ASSESSMENT & PLAN:   Baker's cyst of knee, left Has had significant improvement of her pain. -Counseled on home exercise therapy and supportive care. -Could consider physical therapy or Orthotics going forward.

## 2021-04-05 NOTE — Assessment & Plan Note (Addendum)
Has had significant improvement of her pain. -Counseled on home exercise therapy and supportive care. -Could consider physical therapy or Orthotics going forward.

## 2021-12-28 ENCOUNTER — Emergency Department (HOSPITAL_COMMUNITY): Payer: 59

## 2021-12-28 ENCOUNTER — Other Ambulatory Visit: Payer: Self-pay

## 2021-12-28 ENCOUNTER — Emergency Department (HOSPITAL_COMMUNITY)
Admission: EM | Admit: 2021-12-28 | Discharge: 2021-12-28 | Disposition: A | Discharge status: Home / Self Care | Payer: 59 | Attending: Emergency Medicine | Admitting: Emergency Medicine

## 2021-12-28 ENCOUNTER — Encounter (HOSPITAL_COMMUNITY): Payer: Self-pay

## 2021-12-28 DIAGNOSIS — R1031 Right lower quadrant pain: Secondary | ICD-10-CM | POA: Insufficient documentation

## 2021-12-28 DIAGNOSIS — D72829 Elevated white blood cell count, unspecified: Secondary | ICD-10-CM | POA: Insufficient documentation

## 2021-12-28 DIAGNOSIS — R319 Hematuria, unspecified: Secondary | ICD-10-CM | POA: Insufficient documentation

## 2021-12-28 DIAGNOSIS — R102 Pelvic and perineal pain: Secondary | ICD-10-CM | POA: Diagnosis not present

## 2021-12-28 DIAGNOSIS — R110 Nausea: Secondary | ICD-10-CM | POA: Diagnosis not present

## 2021-12-28 LAB — I-STAT BETA HCG BLOOD, ED (MC, WL, AP ONLY): I-stat hCG, quantitative: <5 m[IU]/mL (ref <5)

## 2021-12-28 LAB — CBC WITH DIFFERENTIAL/PLATELET
Abs Immature Granulocytes: 0.03 10*3/uL (ref 0.00–0.07)
Basophils Absolute: 0.1 10*3/uL (ref 0.0–0.1)
Basophils Relative: 1 %
Eosinophils Absolute: 0 10*3/uL (ref 0.0–0.5)
Eosinophils Relative: 0 %
HCT: 40.2 % (ref 36.0–46.0)
Hemoglobin: 13.9 g/dL (ref 12.0–15.0)
Immature Granulocytes: 0 %
Lymphocytes Relative: 12 %
Lymphs Abs: 1.2 10*3/uL (ref 0.7–4.0)
MCH: 29.5 pg (ref 26.0–34.0)
MCHC: 34.6 g/dL (ref 30.0–36.0)
MCV: 85.4 fL (ref 80.0–100.0)
Monocytes Absolute: 0.5 10*3/uL (ref 0.1–1.0)
Monocytes Relative: 5 %
Neutro Abs: 8.1 10*3/uL — ABNORMAL HIGH (ref 1.7–7.7)
Neutrophils Relative %: 82 %
Platelets: 326 10*3/uL (ref 150–400)
RBC: 4.71 MIL/uL (ref 3.87–5.11)
RDW: 12.2 % (ref 11.5–15.5)
WBC: 9.8 10*3/uL (ref 4.0–10.5)
nRBC: 0 % (ref 0.0–0.2)

## 2021-12-28 LAB — COMPREHENSIVE METABOLIC PANEL
ALT: 27 U/L (ref 0–44)
AST: 36 U/L (ref 15–41)
Albumin: 4.1 g/dL (ref 3.5–5.0)
Alkaline Phosphatase: 46 U/L (ref 38–126)
Anion gap: 6 (ref 5–15)
BUN: 15 mg/dL (ref 6–20)
CO2: 21 mmol/L — ABNORMAL LOW (ref 22–32)
Calcium: 8.6 mg/dL — ABNORMAL LOW (ref 8.9–10.3)
Chloride: 111 mmol/L (ref 98–111)
Creatinine, Ser: 0.96 mg/dL (ref 0.44–1.00)
GFR, Estimated: >60 mL/min (ref >60)
Glucose, Bld: 131 mg/dL — ABNORMAL HIGH (ref 70–99)
Potassium: 4.1 mmol/L (ref 3.5–5.1)
Sodium: 138 mmol/L (ref 135–145)
Total Bilirubin: 0.9 mg/dL (ref 0.3–1.2)
Total Protein: 7.7 g/dL (ref 6.5–8.1)

## 2021-12-28 LAB — URINALYSIS, ROUTINE W REFLEX MICROSCOPIC
Bilirubin Urine: NEGATIVE
Glucose, UA: NEGATIVE mg/dL
Ketones, ur: 80 mg/dL — AB
Leukocytes,Ua: NEGATIVE
Nitrite: NEGATIVE
Protein, ur: NEGATIVE mg/dL
RBC / HPF: >50 RBC/hpf — ABNORMAL HIGH (ref 0–5)
Specific Gravity, Urine: 1.019 (ref 1.005–1.030)
pH: 8 (ref 5.0–8.0)

## 2021-12-28 MED ORDER — ONDANSETRON HCL 4 MG PO TABS: 4.0000 mg | ORAL_TABLET | Freq: Three times a day (TID) | ORAL | 0 refills | Status: DC | PRN

## 2021-12-28 MED ORDER — ONDANSETRON HCL 4 MG/2ML IJ SOLN
4.0000 mg | Freq: Once | INTRAMUSCULAR | Status: AC
  Administered 2021-12-28: 4 mg via INTRAVENOUS
  Filled 2021-12-28: qty 2

## 2021-12-28 MED ORDER — ACETAMINOPHEN 325 MG PO TABS
650.0000 mg | ORAL_TABLET | Freq: Once | ORAL | Status: AC
Start: 2021-12-28 — End: 2021-12-28
  Administered 2021-12-28: 650 mg via ORAL
  Filled 2021-12-28: qty 2

## 2021-12-28 MED ORDER — KETOROLAC TROMETHAMINE 30 MG/ML IJ SOLN
30.0000 mg | Freq: Once | INTRAMUSCULAR | Status: AC
  Administered 2021-12-28: 30 mg via INTRAVENOUS
  Filled 2021-12-28: qty 1

## 2021-12-28 MED ORDER — HYDROMORPHONE HCL 2 MG/ML IJ SOLN
1.0000 mg | Freq: Once | INTRAMUSCULAR | Status: AC
  Administered 2021-12-28: 1 mg via INTRAVENOUS
  Filled 2021-12-28: qty 1

## 2021-12-28 NOTE — ED Provider Notes (Signed)
Patient was signed out to me at 1500 by Dr. Freida Busman pending CT imaging and pelvic ultrasound.  In short this is a 46 year old female with no significant past medical history that presented to the emergency department with right lower quadrant abdominal pain.  She was seen at a walk-in clinic found some hematuria and recommended that she come to the ED.  Patient's labs and imaging reviewed and interpreted by myself show normal blood work and negative pregnancy test.  UA is pending at this time.  Patient's CT scan showed no signs of kidney stone, appendicitis or explanation for her pain.  Patient's pelvic ultrasound was negative for ovarian cysts or torsion.  The patient states that her pain has improved but she is still feeling nauseous and will be given an additional dose of Zofran and will have p.o. challenge.  UA is pending at this time.   Phoebe Sharps, DO 12/28/21 1659

## 2021-12-28 NOTE — ED Triage Notes (Signed)
Pt arrives via EMS from a doctor's office for right lower quadrant pain that radiates to her groin and vagina that started this morning.Victoria Cunningham

## 2021-12-28 NOTE — ED Provider Triage Note (Signed)
Emergency Medicine Provider Triage Evaluation Note  Victoria Cunningham , a 46 y.o. female  was evaluated in triage.  Pt complains of severe right-sided abdominal pain radiating into her back as well as into her groin and pelvis.  She reports pain started this morning and was initially more mild but has become more severe.  She reports pain comes in waves associated with nausea.  Saw her PCP and was sent to the ED via EMS for further evaluation.  History of laparoscopic surgery for endometriosis but no other known abdominal surgeries.  No known history of kidney stones.  Patient having difficulty sitting still due to pain.  Review of Systems  Positive: Abdominal pain, nausea, vomiting, pelvic pain Negative: Chest pain, shortness of breath  Physical Exam  BP (!) 151/111   Pulse 73   Temp 97.8 F (36.6 C) (Oral)   Resp (!) 22   SpO2 100%  Gen:   Awake, very uncomfortable and in some distress, difficulty sitting still due to pain Resp:  Normal effort  MSK:   Moves extremities without difficulty  Other:  Right-sided abdominal tenderness without guarding or rebound  Medical Decision Making  Medically screening exam initiated at 12:56 PM.  Appropriate orders placed.  Victoria Cunningham was informed that the remainder of the evaluation will be completed by another provider, this initial triage assessment does not replace that evaluation, and the importance of remaining in the ED until their evaluation is complete.  Concern for potential kidney stone versus ovarian torsion, will get CT renal stone, if this is negative may need pelvic ultrasound, pain and nausea medication ordered   Dartha Lodge, PA-C 12/28/21 1304

## 2021-12-28 NOTE — ED Notes (Signed)
I provided reinforced discharge education based off of after visit summary/care provided. Pt acknowledged and understood my education. Pt had no further questions/concerns for provider/myself. After visit summary provided to pt. 

## 2021-12-28 NOTE — ED Provider Notes (Signed)
I saw and evaluated the patient, reviewed the resident's note and I agree with the findings and plan.      46 year old female presents with cute onset of right lower quadrant pain which radiates to her groin.  Suspicious for kidney stone versus ovarian torsion.  Patient will receive ovarian ultrasound as well CT renal stone.  She was medicated for pain here   Lorre Nick, MD 12/28/21 1415

## 2021-12-28 NOTE — ED Provider Notes (Signed)
[facility]-EMERGENCY DEPT Provider Note   CSN: [id number] Arrival date & time: 12/28/21  1240     History  Chief Complaint  Patient presents with   Abdominal Pain   Vaginal Pain   Groin Pain    Victoria Cunningham is a 46 y.o. female with no pertinent past medical history presents to the emergency department with current concerns of 5-hour history of right lower quadrant pain.  She states that this is a sharp pain that goes down to her right groin.  She states that she has never had this kind of pain before.  She reports nausea.  She denies any vomiting.  Patient did go to a walk-in clinic, in which they told her to come to the ED to be evaluated.  They did get a UA there which showed blood in her urine.  She reports that she also has some right-sided back pain.  She does endorse that she has had ovarian cysts in the past.  She denies taking anything for the pain.  EMS gave her 50 mcg fentanyl x2 on route to [facility] long ED.  On my exam, patient is in a lot of pain still, and is uncomfortable.  She states that this pain comes and goes in waves.  She states that she is diaphoretic.  Patient denies any history of appendectomy.  She does report that she has an IUD, but there is a chance she could still be pregnant.   Abdominal Pain Associated symptoms: nausea   Associated symptoms: no chest pain, no chills, no constipation, no fever, no shortness of breath and no vomiting   Vaginal Pain Associated symptoms include abdominal pain. Pertinent negatives include no chest pain and no shortness of breath.  Groin Pain Associated symptoms include abdominal pain. Pertinent negatives include no chest pain and no shortness of breath.       Home Medications Prior to Admission medications   Medication Sig Start Date End Date Taking? Authorizing Provider  ibuprofen (ADVIL,MOTRIN) 800 MG tablet Take 1 tablet (800 mg total) by mouth every 8 (eight) hours as needed for moderate pain.  08/17/14   Julio Sicks, NP  meclizine (ANTIVERT) 25 MG tablet Take 1 tablet (25 mg total) by mouth 3 (three) times daily as needed for dizziness. 02/19/18   Melene Plan, DO  meloxicam (MOBIC) 7.5 MG tablet Take 7.5 mg by mouth daily.    [provider]  predniSONE (DELTASONE) 5 MG tablet Take 6 pills for first day, 5 pills second day, 4 pills third day, 3 pills fourth day, 2 pills the fifth day, and 1 pill sixth day. 03/15/21   Myra Rude, MD  Prenatal Vit-Fe Fumarate-FA (PRENATAL MULTIVITAMIN) TABS Take 1 tablet by mouth at bedtime.     [provider]      Allergies    Patient has no known allergies.    Review of Systems   Review of Systems  Constitutional:  Positive for diaphoresis. Negative for chills and fever.  Respiratory:  Negative for shortness of breath.   Cardiovascular:  Negative for chest pain.  Gastrointestinal:  Positive for abdominal pain and nausea. Negative for constipation and vomiting.  Genitourinary:  Positive for flank pain (Right) and vaginal pain.    Physical Exam Updated Vital Signs BP (!) 151/111   Pulse 73   Temp 97.8 F (36.6 C) (Oral)   Resp (!) 22   Ht 5\' 3"  (1.6 m)   Wt 104.3 kg   SpO2 100%  BMI 40.74 kg/m  Physical Exam  General: Patient is in bed in acute distress. Head: Normocephalic, atraumatic  Abdomen: There is right lower quadrant tenderness.  No other tenderness appreciated.  No rashes noted.  Normal active bowel sounds.  No rebound or guarding. Back: There is some right-sided flank tenderness. Skin: No rashes noted    ED Results / Procedures / Treatments   Labs (all labs ordered are listed, but only abnormal results are displayed) Labs Reviewed  COMPREHENSIVE METABOLIC PANEL  CBC WITH DIFFERENTIAL/PLATELET  URINALYSIS, ROUTINE W REFLEX MICROSCOPIC  I-STAT BETA HCG BLOOD, ED ([facility], [facility], [facility] ONLY)    EKG None  Radiology No results found.  Procedures Procedures    Medications Ordered in ED Medications   ondansetron (ZOFRAN) injection 4 mg (has no administration in time range)  HYDROmorphone (DILAUDID) injection 1 mg (has no administration in time range)    ED Course/ Medical Decision Making/ A&P                           Medical Decision Making This is a 46 year old female with no pertinent medical history presenting to the emergency room with acute onset of right lower quadrant pain.  Patient is seen to be in acute distress.  Elevated blood pressure 151/111.  Patient was seen in a walk-in clinic, and seen to have hematuria.  Patient sent here to be evaluated.  In route, patient given 50 mcg fentanyl x2.  Patient still in pain, this pain is radiating into the groin on the right side.  On exam, patient has right flank tenderness as well as right lower pubic tenderness.  Given history and physical, patient will be evaluated for acute kidney stone.  Patient also will be evaluated for possible ectopic pregnancy.  Patient also be evaluated for possible ovarian torsion.  Would also like to rule out appendicitis.  With this is less likely on my differential.  Patient will be given Dilaudid in the emergency department.  Patient will also be given Zofran.  Obtain vaginal ultrasound with Doppler follow-up.  Obtain CT. We will also obtain UA, CMP, CBC, and pregnancy test.  Amount and/or Complexity of Data Reviewed Radiology: ordered.   1448: CBC unremarkable at this time.  No white count.  Stable hemoglobin.  Awaiting ultrasound and CT.  1458: CMP showing slightly increased glucose, but otherwise unremarkable. Ca 8.6.  1513: Patient currently getting ultrasound.  Patient still awaiting CT.  Patient care will be transitioned to provider that is coming on.         Final Clinical Impression(s) / ED Diagnoses Final diagnoses:  None    Rx / DC Orders ED Discharge Orders     None         Modena Slater, DO 12/28/21 1514    Lorre Nick, MD 12/30/21 [phone]

## 2021-12-28 NOTE — ED Notes (Signed)
Pt given crackers and drink. Will reassess nausea.

## 2021-12-28 NOTE — Discharge Instructions (Signed)
You were seen in the emergency department for your abdominal pain.  Your urine here again showed blood in the urine but showed no signs of urinary tract infection.  Your CAT scan showed no signs of kidney stones or cause of the bleeding.  Your CAT scan also showed no signs of appendicitis or other abdominal infection as causes of your pain.  Your ultrasound showed normal-appearing ovaries and uterus.  You can continue to take Tylenol and Motrin as needed for pain and Zofran as needed for nausea.  You should follow-up with your primary doctor in the next few days to have your symptoms rechecked and to have your urine rechecked to see if you are still having hematuria.  If you are you may need additional work-up by urology.  You should return to the emergency department if your pain gets significantly worse, you have fevers, you have repetitive vomiting and cannot keep down any water, or if you have any other new or concerning symptoms.

## 2022-08-28 ENCOUNTER — Encounter: Payer: Self-pay | Admitting: *Deleted

## 2022-12-25 ENCOUNTER — Other Ambulatory Visit: Payer: Self-pay | Admitting: Obstetrics and Gynecology

## 2022-12-25 DIAGNOSIS — R928 Other abnormal and inconclusive findings on diagnostic imaging of breast: Secondary | ICD-10-CM

## 2023-01-03 ENCOUNTER — Ambulatory Visit
Admission: RE | Admit: 2023-01-03 | Discharge: 2023-01-03 | Disposition: A | Discharge status: Home / Self Care | Payer: 59 | Source: Ambulatory Visit | Attending: Obstetrics and Gynecology | Admitting: Obstetrics and Gynecology

## 2023-01-03 ENCOUNTER — Other Ambulatory Visit: Payer: Self-pay | Admitting: Obstetrics and Gynecology

## 2023-01-03 DIAGNOSIS — R928 Other abnormal and inconclusive findings on diagnostic imaging of breast: Secondary | ICD-10-CM

## 2023-01-03 DIAGNOSIS — N6310 Unspecified lump in the right breast, unspecified quadrant: Secondary | ICD-10-CM

## 2023-01-05 ENCOUNTER — Ambulatory Visit: Admission: RE | Admit: 2023-01-05 | Payer: 59 | Source: Ambulatory Visit

## 2023-01-05 ENCOUNTER — Inpatient Hospital Stay: Admission: RE | Admit: 2023-01-05 | Payer: 59 | Source: Ambulatory Visit

## 2023-01-05 ENCOUNTER — Other Ambulatory Visit: Payer: Self-pay | Admitting: Obstetrics and Gynecology

## 2023-01-05 DIAGNOSIS — N6310 Unspecified lump in the right breast, unspecified quadrant: Secondary | ICD-10-CM

## 2023-01-05 HISTORY — PX: BREAST BIOPSY: SHX20

## 2023-01-09 ENCOUNTER — Telehealth: Payer: Self-pay | Admitting: Hematology and Oncology

## 2023-01-09 NOTE — Telephone Encounter (Signed)
Spoke to patient to confirm upcoming morning [facility] clinic appointment on 9/4, paperwork will be sent via e-mail.   Gave location and time, also informed patient that the surgeon's office would be calling as well to get information from them similar to the packet that they will be receiving so make sure to do both.  Reminded patient that all providers will be coming to the clinic to see them HERE and if they had any questions to not hesitate to reach back out to myself or their navigators.

## 2023-01-16 ENCOUNTER — Encounter: Payer: Self-pay | Admitting: *Deleted

## 2023-01-16 DIAGNOSIS — Z170 Estrogen receptor positive status [ER+]: Secondary | ICD-10-CM | POA: Insufficient documentation

## 2023-01-17 ENCOUNTER — Ambulatory Visit: Payer: Self-pay | Admitting: Surgery

## 2023-01-17 ENCOUNTER — Encounter: Payer: Self-pay | Admitting: *Deleted

## 2023-01-17 ENCOUNTER — Other Ambulatory Visit: Payer: Self-pay

## 2023-01-17 ENCOUNTER — Inpatient Hospital Stay (HOSPITAL_BASED_OUTPATIENT_CLINIC_OR_DEPARTMENT_OTHER): Payer: 59 | Admitting: Genetic Counselor

## 2023-01-17 ENCOUNTER — Inpatient Hospital Stay: Payer: 59

## 2023-01-17 ENCOUNTER — Inpatient Hospital Stay: Payer: 59 | Attending: Hematology and Oncology | Admitting: Hematology and Oncology

## 2023-01-17 ENCOUNTER — Encounter: Payer: Self-pay | Admitting: General Practice

## 2023-01-17 ENCOUNTER — Ambulatory Visit
Admission: RE | Admit: 2023-01-17 | Discharge: 2023-01-17 | Disposition: A | Discharge status: Home / Self Care | Payer: 59 | Source: Ambulatory Visit | Attending: Radiation Oncology | Admitting: Radiation Oncology

## 2023-01-17 ENCOUNTER — Encounter: Payer: Self-pay | Admitting: Physical Therapy

## 2023-01-17 ENCOUNTER — Ambulatory Visit: Payer: 59 | Attending: Surgery | Admitting: Physical Therapy

## 2023-01-17 VITALS — BP 123/63 | HR 76 | Temp 97.7°F | Resp 18 | Ht 63.0 in | Wt 230.4 lb

## 2023-01-17 DIAGNOSIS — Z170 Estrogen receptor positive status [ER+]: Secondary | ICD-10-CM | POA: Insufficient documentation

## 2023-01-17 DIAGNOSIS — Z79899 Other long term (current) drug therapy: Secondary | ICD-10-CM | POA: Insufficient documentation

## 2023-01-17 DIAGNOSIS — C50211 Malignant neoplasm of upper-inner quadrant of right female breast: Secondary | ICD-10-CM | POA: Diagnosis not present

## 2023-01-17 DIAGNOSIS — C50911 Malignant neoplasm of unspecified site of right female breast: Secondary | ICD-10-CM

## 2023-01-17 DIAGNOSIS — R293 Abnormal posture: Secondary | ICD-10-CM | POA: Insufficient documentation

## 2023-01-17 DIAGNOSIS — Z803 Family history of malignant neoplasm of breast: Secondary | ICD-10-CM

## 2023-01-17 LAB — CBC WITH DIFFERENTIAL (CANCER CENTER ONLY)
Abs Immature Granulocytes: 0.01 10*3/uL (ref 0.00–0.07)
Basophils Absolute: 0 10*3/uL (ref 0.0–0.1)
Basophils Relative: 1 %
Eosinophils Absolute: 0.1 10*3/uL (ref 0.0–0.5)
Eosinophils Relative: 2 %
HCT: 42.1 % (ref 36.0–46.0)
Hemoglobin: 14.1 g/dL (ref 12.0–15.0)
Immature Granulocytes: 0 %
Lymphocytes Relative: 32 %
Lymphs Abs: 1.9 10*3/uL (ref 0.7–4.0)
MCH: 29 pg (ref 26.0–34.0)
MCHC: 33.5 g/dL (ref 30.0–36.0)
MCV: 86.6 fL (ref 80.0–100.0)
Monocytes Absolute: 0.4 10*3/uL (ref 0.1–1.0)
Monocytes Relative: 7 %
Neutro Abs: 3.4 10*3/uL (ref 1.7–7.7)
Neutrophils Relative %: 58 %
Platelet Count: 239 10*3/uL (ref 150–400)
RBC: 4.86 MIL/uL (ref 3.87–5.11)
RDW: 12.5 % (ref 11.5–15.5)
WBC Count: 5.8 10*3/uL (ref 4.0–10.5)
nRBC: 0 % (ref 0.0–0.2)

## 2023-01-17 LAB — CMP (CANCER CENTER ONLY)
ALT: 24 U/L (ref 0–44)
AST: 24 U/L (ref 15–41)
Albumin: 4.3 g/dL (ref 3.5–5.0)
Alkaline Phosphatase: 48 U/L (ref 38–126)
Anion gap: 6 (ref 5–15)
BUN: 11 mg/dL (ref 6–20)
CO2: 28 mmol/L (ref 22–32)
Calcium: 9 mg/dL (ref 8.9–10.3)
Chloride: 105 mmol/L (ref 98–111)
Creatinine: 0.78 mg/dL (ref 0.44–1.00)
GFR, Estimated: >60 mL/min (ref >60)
Glucose, Bld: 87 mg/dL (ref 70–99)
Potassium: 3.8 mmol/L (ref 3.5–5.1)
Sodium: 139 mmol/L (ref 135–145)
Total Bilirubin: 0.5 mg/dL (ref 0.3–1.2)
Total Protein: 7.6 g/dL (ref 6.5–8.1)

## 2023-01-17 LAB — GENETIC SCREENING ORDER

## 2023-01-17 LAB — RESEARCH LABS

## 2023-01-17 NOTE — Research (Signed)
[employer] 2021-05 - Specimen Collection Study to Evaluate Biomarkers in Subjects with Cancer   Patient Victoria Cunningham was identified by Dr Al Pimple as a potential candidate for the above listed study.  This Clinical Research Nurse met with Victoria Cunningham, [id number], on 01/17/23 in a manner and location that ensures patient privacy to discuss participation in the above listed research study.  Patient is Accompanied by her husband .  A copy of the informed consent document with embedded HIPAA language was provided to the patient.  Patient reads, speaks, and understands [place].    Patient was provided with the business card of this Nurse and encouraged to contact the research team with any questions.  Patient was provided the option of taking informed consent documents home to review and was encouraged to review at their convenience with their support network, including other care providers. Patient is comfortable with making a decision regarding study participation today.  As outlined in the informed consent form, this Nurse and Victoria Cunningham discussed the purpose of the research study, the investigational nature of the study, study procedures and requirements for study participation, potential risks and benefits of study participation, as well as alternatives to participation. This study is not blinded. The patient understands participation is voluntary and they may withdraw from study participation at any time.  This study does not involve randomization.  This study does not involve an investigational drug or device. This study does not involve a placebo. Patient understands enrollment is pending full eligibility review.   Confidentiality and how the patient's information will be used as part of study participation were discussed.  Patient was informed there is reimbursement provided for their time and effort spent on trial participation.  The patient is encouraged to discuss research study  participation with their insurance provider to determine what costs they may incur as part of study participation, including research related injury.    All questions were answered to patient's satisfaction.  The informed consent with embedded HIPAA language was reviewed page by page.  The patient's mental and emotional status is appropriate to provide informed consent, and the patient verbalizes an understanding of study participation.  Patient has agreed to participate in the above listed research study and has voluntarily signed the informed consent 13 Jun 2021 version with embedded HIPAA language, version 13 Jun 2021  on 01/17/23 at 1200PM.  The patient was provided with a copy of the signed informed consent form with embedded HIPAA language for their reference.  No study specific procedures were obtained prior to the signing of the informed consent document.  Approximately 10 minutes were spent with the patient reviewing the informed consent documents.  Patient was not requested to complete a Release of Information form.  [name] Marlo Goodrich, RN, BSN, [facility] She  Her  Hers Clinical Research Nurse [facility] Direct Dial [phone]  Pager [phone] 01/17/2023 12:09 PM

## 2023-01-17 NOTE — Research (Signed)
[employer] 2021-05 - Specimen Collection Study to Evaluate Biomarkers in Subjects with Cancer    This Nurse has reviewed this patient's inclusion and exclusion criteria and confirmed [name] is eligible for study participation.  Patient will continue with enrollment.  Eligibility confirmed by treating investigator, who also agrees that patient should proceed with enrollment.   Medical History:  High Blood Pressure  No Coronary Artery Disease No Lupus    No Rheumatoid Arthritis  No Diabetes   No      Lynch Syndrome  No  Is the patient currently taking a magnesium supplement?   No  Does the patient have a personal history of cancer (greater than 5 years ago)?  No  Does the patient have a family history of cancer in 1st or 2nd degree relatives? Yes If yes, Relationship(s) and Cancer type(s)? Father: ALL  Does the patient have history of alcohol consumption? No    Does the patient have history of cigarette, cigar, pipe, or chewing tobacco use?  No   Victoria Cunningham G. Deneisha Dade, RN, BSN, [facility] She  Her  Hers Clinical Research Nurse [facility] Direct Dial [phone]  Pager [phone] 01/17/2023 3:08 PM

## 2023-01-17 NOTE — Research (Signed)
[employer] 2021-05 - Specimen Collection Study to Evaluate Biomarkers in Subjects with Cancer    This Nurse has reviewed this patient's inclusion and exclusion criteria as a second review and confirms Victoria Cunningham is eligible for study participation.  Patient may continue with enrollment.   Janan Ridge RN, BSN, CCRP Clinical Research Nurse Lead 01/17/2023 12:33 PM

## 2023-01-17 NOTE — Progress Notes (Signed)
[facility] Spiritual Care Note  Received a community referral to see Norean to provide additional support by introducing Spiritual Care as part of her support team.  Met Fraida and her husband briefly in [facility], bringing care package of handmade blessing blanket, journal, and art/reflection supplies to facilitate creative processing.   Given the need to see multiple providers today, we plan to follow up by phone next week.   [address] [place], [place], [facility] Pager [phone] Voicemail [phone]

## 2023-01-17 NOTE — Research (Signed)
Trial:  Effectiveness of Out-of-Pocket Psychologist, forensic ([employer]) in Cancer Patients Patient Victoria Cunningham was identified by Dr Al Pimple as a potential candidate for the above listed study.  This Clinical Research Nurse met with Victoria Cunningham, [id number], on 01/17/23 in a manner and location that ensures patient privacy to discuss participation in the above listed research study.  Patient is Accompanied by her husband .  A copy of the informed consent document and separate HIPAA Authorization was provided to the patient.  Patient reads, speaks, and understands [place].   Patient was provided with the business card of this Nurse and encouraged to contact the research team with any questions.  Approximately 10 minutes were spent with the patient reviewing the informed consent documents.  Patient was provided the option of taking informed consent documents home to review and was encouraged to review at their convenience with their support network, including other care providers. Patient took the consent documents home to review. [name] [name], RN, BSN, [facility] She  Her  Hers Clinical Research Nurse [facility] Direct Dial [phone]  Pager [phone] 01/17/2023 3:09 PM

## 2023-01-17 NOTE — Progress Notes (Signed)
Radiation Oncology         [phone] ________________________________  Name: Victoria Cunningham        MRN: [id number]  Date of Service: 01/17/2023 DOB: 21-Feb-1976  NW:GNFAO, [facility], Maisie Fus, MD     REFERRING PHYSICIAN: Harriette Bouillon, MD   DIAGNOSIS: The encounter diagnosis was Malignant neoplasm of upper-inner quadrant of right breast in female, estrogen receptor positive (HCC).   Cancer Staging  Malignant neoplasm of upper-inner quadrant of right breast in female, estrogen receptor positive (HCC) Staging form: Breast, AJCC 8th Edition - Clinical stage from 01/17/2023: Stage IA (cT1b, cN0, cM0, G2, ER+, PR+, HER2-) - Unsigned Stage prefix: Initial diagnosis Histologic grading system: 3 grade system Laterality: Right Staged by: Pathologist and managing physician Stage used in treatment planning: Yes National guidelines used in treatment planning: Yes Type of national guideline used in treatment planning: NCCN    HISTORY OF PRESENT ILLNESS: Victoria Cunningham is a 47 y.o. female seen in the multidisciplinary breast clinic for a new diagnosis of right breast cancer. The patient was noted to have an asymmetry on screening mammogram. She proceeded with diagnostic mammogram and ultrasound that showed a 0.8 cm mass in the UIQ. No abnormal findings were seen in the right axilla. 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   She is seen today to discuss treatment recommendations of her cancer.      PREVIOUS RADIATION THERAPY: No   PAST MEDICAL HISTORY:  Past Medical History:  Diagnosis Date   No pertinent past medical history    PONV (postoperative nausea and vomiting)        PAST SURGICAL HISTORY: Past Surgical History:  Procedure Laterality Date   BREAST BIOPSY Right 01/05/2023   [place] RT BREAST BX W LOC DEV 1ST LESION IMG BX SPEC US GUIDE 01/05/2023  [facility] MAMMOGRAPHY   lapriscopic     2009 r/o endometriosis     FAMILY HISTORY:  Family History  Problem Relation Age of Onset   Heart disease Father    Diabetes Father    Cancer Father        leaukemia   Cancer Maternal Grandfather    Cancer Paternal Grandfather    Anesthesia problems Neg Hx      SOCIAL HISTORY:  reports that she has never smoked. She has never used smokeless tobacco. She reports that she does not drink alcohol and does not use drugs. She is a mom to four children and works as a Warden/ranger at [employer].    ALLERGIES: Patient has no known allergies.   MEDICATIONS:  Current Outpatient Medications  Medication Sig Dispense Refill   ibuprofen (ADVIL,MOTRIN) 800 MG tablet Take 1 tablet (800 mg total) by mouth every 8 (eight) hours as needed for moderate pain. 30 tablet 1   meclizine (ANTIVERT) 25 MG tablet Take 1 tablet (25 mg total) by mouth 3 (three) times daily as needed for dizziness. 30 tablet 0   meloxicam (MOBIC) 7.5 MG tablet Take 7.5 mg by mouth daily.     ondansetron (ZOFRAN) 4 MG tablet Take 1 tablet (4 mg total) by mouth every 8 (eight) hours as needed for nausea or vomiting. 4 tablet 0   predniSONE (DELTASONE) 5 MG tablet Take 6 pills for first day, 5 pills second day, 4 pills third day, 3 pills fourth day, 2 pills the fifth  day, and 1 pill sixth day. 21 tablet 0   Prenatal Vit-Fe Fumarate-FA (PRENATAL MULTIVITAMIN) TABS Take 1 tablet by mouth at bedtime.      No current facility-administered medications for this visit.     REVIEW OF SYSTEMS: On review of systems, the patient reports that she is doing well overall. No breast specific complaints are verbalized.        PHYSICAL EXAM:  Wt Readings from Last 3 Encounters:  01/17/23 230 lb 6.4 oz (104.5 kg)  12/28/21 230 lb (104.3 kg)  04/05/21 229 lb (103.9 kg)   Temp Readings from Last 3 Encounters:  01/17/23 97.7 F (36.5 C) (Tympanic)  12/28/21 (!) 97.5 F (36.4 C) (Oral)   03/11/21 98.1 F (36.7 C) (Oral)   BP Readings from Last 3 Encounters:  01/17/23 123/63  12/28/21 121/84  04/05/21 130/80   Pulse Readings from Last 3 Encounters:  01/17/23 76  12/28/21 (!) 58  03/11/21 76    In general this is a well appearing  female in no acute distress. She's alert and oriented x4 and appropriate throughout the examination. Cardiopulmonary assessment is negative for acute distress and she exhibits normal effort. Bilateral breast exam is deferred.    ECOG = 0  0 - Asymptomatic (Fully active, able to carry on all predisease activities without restriction)  1 - Symptomatic but completely ambulatory (Restricted in physically strenuous activity but ambulatory and able to carry out work of a light or sedentary nature. For example, light housework, office work)  2 - Symptomatic, <50% in bed during the day (Ambulatory and capable of all self care but unable to carry out any work activities. Up and about more than 50% of waking hours)  3 - Symptomatic, >50% in bed, but not bedbound (Capable of only limited self-care, confined to bed or chair 50% or more of waking hours)  4 - Bedbound (Completely disabled. Cannot carry on any self-care. Totally confined to bed or chair)  5 - Death   Santiago Glad MM, Creech RH, Tormey DC, et al. [phone]). "Toxicity and response criteria of the [facility]". Am. Evlyn Clines. Oncol. 5 (6): 649-55    LABORATORY DATA:  Lab Results  Component Value Date   WBC 5.8 01/17/2023   HGB 14.1 01/17/2023   HCT 42.1 01/17/2023   MCV 86.6 01/17/2023   PLT 239 01/17/2023   Lab Results  Component Value Date   NA 139 01/17/2023   K 3.8 01/17/2023   CL 105 01/17/2023   CO2 28 01/17/2023   Lab Results  Component Value Date   ALT 24 01/17/2023   AST 24 01/17/2023   ALKPHOS 48 01/17/2023   BILITOT 0.5 01/17/2023      RADIOGRAPHY: [place] RT BREAST BX W LOC DEV 1ST LESION IMG BX SPEC US GUIDE  Addendum Date: 01/08/2023    ADDENDUM REPORT: 01/08/2023 14:04 ADDENDUM: PATHOLOGY revealed: Site Breast, RIGHT, needle core biopsy, upper inner, 2 o'clock, 5 cm fn - INVASIVE DUCTAL CARCINOMA, SEE NOTE DUCTAL CARCINOMA IN SITU, SOLID AND CRIBRIFORM TYPE WITH SINGLE CELL NECROSIS, NUCLEAR GRADE 2 OF 3 - TUBULE FORMATION: SCORE 3/3 - NUCLEAR PLEOMORPHISM: SCORE 2/3 - MITOTIC COUNT: SCORE 1/3 - TOTAL SCORE: 6/9 - OVERALL GRADE: II/III - LYMPHOVASCULAR INVASION: NOT IDENTIFIED - CANCER LENGTH: 10 MM IN GREATEST LINEAR DIMENSION - CALCIFICATIONS: NOT IDENTIFIED - OTHER FINDINGS: N/A Pathology results are CONCORDANT with imaging findings, per Dr. Laveda Abbe. Pathology results and recommendations below were discussed with patient by telephone on  01/08/2023. Patient reported biopsy site with slight tenderness at the site. Post biopsy care instructions were reviewed, questions were answered and my direct phone number was provided to patient. Patient was instructed to call the [facility] if any concerns or questions arise related to the biopsy. The patient was referred to the [facility] at [facility] with appointment on 01/17/2023. Pathology results reported by Lynett Grimes, RN on 01/08/2023. Electronically Signed   By: Harmon Pier M.D.   On: 01/08/2023 14:04   Result Date: 01/08/2023 CLINICAL DATA:  47 year old female presents for tissue sampling of UPPER INNER RIGHT breast mass. EXAM: ULTRASOUND GUIDED RIGHT BREAST CORE NEEDLE BIOPSY COMPARISON:  Previous exam(s). PROCEDURE: I met with the patient and we discussed the procedure of ultrasound-guided biopsy, including benefits and alternatives. We discussed the high likelihood of a successful procedure. We discussed the risks of the procedure, including infection, bleeding, tissue injury, clip migration, and inadequate sampling. Informed written consent was given. The usual time-out protocol was performed immediately prior  to the procedure. Lesion quadrant: UPPER INNER RIGHT breast Using sterile technique and 1% Lidocaine as local anesthetic, under direct ultrasound visualization, a 12 gauge spring-loaded device was used to perform biopsy of the 1.5 cm mass at the 2 o'clock position of the RIGHT breast using a LATERAL approach. At the conclusion of the procedure a RIBBON shaped tissue marker clip was deployed into the biopsy cavity. Follow up 2 view mammogram was performed and dictated separately. IMPRESSION: Ultrasound guided biopsy of 1.5 cm UPPER INNER RIGHT breast mass. No apparent complications. Electronically Signed: By: Harmon Pier M.D. On: 01/05/2023 09:10   MM CLIP PLACEMENT RIGHT  Result Date: 01/05/2023 CLINICAL DATA:  Evaluate RIBBON clip placement following ultrasound-guided RIGHT breast biopsy EXAM: 3D DIAGNOSTIC RIGHT MAMMOGRAM POST ULTRASOUND BIOPSY COMPARISON:  Previous exam(s). FINDINGS: 3D Mammographic images were obtained following ultrasound guided biopsy of the 1.5 cm mass in the UPPER INNER RIGHT breast. The biopsy marking clip is in expected position at the site of biopsy. IMPRESSION: Appropriate positioning of the RIBBON shaped biopsy marking clip at the site of biopsy in the UPPER INNER RIGHT breast. Final Assessment: Post Procedure Mammograms for Marker Placement Electronically Signed   By: Harmon Pier M.D.   On: 01/05/2023 09:31   MM 3D DIAGNOSTIC MAMMOGRAM UNILATERAL RIGHT BREAST  Result Date: 01/03/2023 CLINICAL DATA:  Patient was recalled from screening mammogram for a right breast mass. EXAM: DIGITAL DIAGNOSTIC UNILATERAL RIGHT MAMMOGRAM WITH TOMOSYNTHESIS AND CAD; ULTRASOUND RIGHT BREAST LIMITED TECHNIQUE: Right digital diagnostic mammography and breast tomosynthesis was performed. The images were evaluated with computer-aided detection. ; Targeted ultrasound examination of the right breast was performed COMPARISON:  Previous exam(s). ACR Breast Density Category b: There are scattered areas of  fibroglandular density. FINDINGS: Additional imaging of the right breast was performed. There is persistence of a 1.5 cm spiculated mass in the upper-inner quadrant of the breast. There are no malignant type microcalcifications. On physical exam, do not palpate a mass in the upper-inner quadrant of the right breast. Targeted ultrasound is performed, showing an irregular hypoechoic mass with shadowing in the right breast at 2 o'clock 5 cm from the nipple measuring 8 x 7 x 8 mm. Sonographic evaluation of the right axilla does not show any enlarged adenopathy. IMPRESSION: Suspicious 1.5 cm mass in the upper-inner quadrant of the right breast. RECOMMENDATION: Ultrasound-guided core biopsy of the mass in the 2 o'clock region of the right  breast 5 cm from the nipple is recommended. I have discussed the findings and recommendations with the patient. If applicable, a reminder letter will be sent to the patient regarding the next appointment. BI-RADS CATEGORY  5: Highly suggestive of malignancy. Electronically Signed   By: Baird Lyons M.D.   On: 01/03/2023 16:18   [place] LIMITED ULTRASOUND INCLUDING AXILLA RIGHT BREAST  Result Date: 01/03/2023 CLINICAL DATA:  Patient was recalled from screening mammogram for a right breast mass. EXAM: DIGITAL DIAGNOSTIC UNILATERAL RIGHT MAMMOGRAM WITH TOMOSYNTHESIS AND CAD; ULTRASOUND RIGHT BREAST LIMITED TECHNIQUE: Right digital diagnostic mammography and breast tomosynthesis was performed. The images were evaluated with computer-aided detection. ; Targeted ultrasound examination of the right breast was performed COMPARISON:  Previous exam(s). ACR Breast Density Category b: There are scattered areas of fibroglandular density. FINDINGS: Additional imaging of the right breast was performed. There is persistence of a 1.5 cm spiculated mass in the upper-inner quadrant of the breast. There are no malignant type microcalcifications. On physical exam, do not palpate a mass in the upper-inner  quadrant of the right breast. Targeted ultrasound is performed, showing an irregular hypoechoic mass with shadowing in the right breast at 2 o'clock 5 cm from the nipple measuring 8 x 7 x 8 mm. Sonographic evaluation of the right axilla does not show any enlarged adenopathy. IMPRESSION: Suspicious 1.5 cm mass in the upper-inner quadrant of the right breast. RECOMMENDATION: Ultrasound-guided core biopsy of the mass in the 2 o'clock region of the right breast 5 cm from the nipple is recommended. I have discussed the findings and recommendations with the patient. If applicable, a reminder letter will be sent to the patient regarding the next appointment. BI-RADS CATEGORY  5: Highly suggestive of malignancy. Electronically Signed   By: Baird Lyons M.D.   On: 01/03/2023 16:18       IMPRESSION/PLAN: 1. Stage IA Invasive Ductal Carcinoma (cT1b, cN0, cM0) Grade II, ER/PR+, HER2-  Dr. Mitzi Hansen discussed the pathology findings and reviewed the nature of early stage breast disease. The consensus from the breast conference includes lumpectomy, followed by Oncotype testing, radiation treatment, and anti-hormone therapy. Dr. Mitzi Hansen recommends external beam radiotherapy to the breast following her lumpectomy to reduce risks of local recurrence followed by antiestrogen therapy. We discussed the risks, benefits, short, and long term effects of radiotherapy, as well as the curative intent, and the patient is interested in proceeding. Dr. Mitzi Hansen discussed the delivery and logistics of radiotherapy and anticipates a course of 4-6.5 weeks weeks of radiotherapy to the right. Of note, We discussed the traditional 6.5-week course of treatment and the newer hypofractionated 4-week course. While the hypofractionated treatment has less long-term data, we still offer the 6.5-week course for younger patients. Patient expressed understanding and would like to wait until after surgery to make final decision on which course of treatment to choose.   We will see her back a few weeks after surgery to discuss the simulation process and anticipate starting radiotherapy about 4-6 weeks after surgery.   2. Possible genetic predisposition to malignancy. The patient is a candidate for genetic testing given her personal and family history. She will meet with our geneticist today in clinic.   In a visit lasting 60 minutes, greater than 50% of the time was spent face to face reviewing her case, as well as in preparation of, discussing, and coordinating the patient's care.  The above documentation reflects my direct findings during this shared patient visit. Please see the separate note by Dr.  Moody on this date for the remainder of the patient's plan of care.    Joyice Faster, [place]    **Disclaimer: This note was dictated with voice recognition software. Similar sounding words can inadvertently be transcribed and this note may contain transcription errors which may not have been corrected upon publication of note.**

## 2023-01-17 NOTE — Assessment & Plan Note (Signed)
This is a very pleasant 47 year old female patient with newly diagnosed right breast invasive ductal carcinoma, grade 2, ER/PR positive, HER2 negative referred to breast [facility] for additional recommendations.  Given small tumor measuring 8 x 7 x 8 mm, node-negative T, strong ER/PR positivity she will proceed with upfront surgery followed by consideration for Oncotype DX testing.  If she does have Oncotype DX score between 21-25 or if her score falls between 16-20 and high clinical risk, then we will consider her for offset trial.  I have discussed about offset trial today in detail which randomizes patients to chemotherapy versus ovarian function suppression if there eligible.  She is very willing to consider offset if she is a right candidate for this.  If Oncotype DX is low and does not warrant any chemotherapy she will then proceed with adjuvant radiation followed by antiestrogen therapy.  Given her premenopausal status have discussed about tamoxifen today.  We have discussed options for antiestrogen therapy today. With regards to Tamoxifen, we discussed that this is a SERM, selective estrogen receptor modulator. We discussed mechanism of action of Tamoxifen, adverse effects on Tamoxifen including but not limited to post menopausal symptoms, increased risk of DVT/PE, increased risk of endometrial cancer, questionable cataracts with long term use and increased risk of cardiovascular events in the study which was not statistically significant. A benefit from Tamoxifen would be improvement in bone density.  She will return to clinic after surgery to review final pathology and Oncotype DX results and to discuss any additional adjuvant recommendations.  Genetic testing recommended.  She should also consider getting the Mirena removed and replace it with copper IUD.

## 2023-01-17 NOTE — Therapy (Signed)
OUTPATIENT PHYSICAL THERAPY BREAST CANCER BASELINE EVALUATION   Patient Name: Victoria Cunningham MRN: [id number] DOB:1976-02-12, 47 y.o., female Today's Date: 01/17/2023  END OF SESSION:  PT End of Session - 01/17/23 1058     Visit Number 1    Number of Visits 2    Date for PT Re-Evaluation 03/14/23    PT Start Time 0940    PT Stop Time 1008    PT Time Calculation (min) 28 min    Activity Tolerance Patient tolerated treatment well    Behavior During Therapy [facility] for tasks assessed/performed             Past Medical History:  Diagnosis Date   No pertinent past medical history    PONV (postoperative nausea and vomiting)    Past Surgical History:  Procedure Laterality Date   BREAST BIOPSY Right 01/05/2023   [place] RT BREAST BX W LOC DEV 1ST LESION IMG BX SPEC US GUIDE 01/05/2023 [facility]   lapriscopic     2009 r/o endometriosis   Patient Active Problem List   Diagnosis Date Noted   Malignant neoplasm of upper-inner quadrant of right breast in female, estrogen receptor positive (HCC) 01/16/2023   Baker's cyst of knee, left 03/16/2021   Indication for care in labor and delivery, antepartum 08/15/2014   Vaginal bleeding in pregnancy    [redacted] weeks gestation of pregnancy     REFERRING PROVIDER: Dr. Harriette Bouillon  REFERRING DIAG: Right breast cancer  THERAPY DIAG:  Malignant neoplasm of upper-inner quadrant of right breast in female, estrogen receptor positive (HCC)  Abnormal posture  Rationale for Evaluation and Treatment: Rehabilitation  ONSET DATE: 12/19/2022  SUBJECTIVE:                                                                                                                                                                                           SUBJECTIVE STATEMENT: Patient reports she is here today to be seen by her medical team for her newly diagnosed right breast cancer.   PERTINENT HISTORY:  Patient was diagnosed on 12/19/2022 with right grade 2  invasive ductal carcinoma breast cancer. It measures 8 mm and is located in the upper inner quadrant. It is ER/PR positive and HER2 negative with a Ki67 of 5%.   PATIENT GOALS:   reduce lymphedema risk and learn post op HEP.   PAIN:  Are you having pain? No  PRECAUTIONS: Active CA   RED FLAGS: None   HAND DOMINANCE: right  WEIGHT BEARING RESTRICTIONS: No  FALLS:  Has patient fallen in last 6 months? 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 [employer]: She does not exercise  PRIOR LEVEL OF FUNCTION: Independent   OBJECTIVE:  COGNITION: Overall cognitive status: Within functional limits for tasks assessed    POSTURE:  Forward head and rounded shoulders posture  UPPER EXTREMITY AROM/PROM:  A/PROM RIGHT   eval   Shoulder extension 57  Shoulder flexion 155  Shoulder abduction 173  Shoulder internal rotation 81  Shoulder external rotation 87    (Blank rows = not tested)  A/PROM LEFT   eval  Shoulder extension 53  Shoulder flexion 159  Shoulder abduction 172  Shoulder internal rotation 74  Shoulder external rotation 80    (Blank rows = not tested)  CERVICAL AROM: All within normal limits  UPPER EXTREMITY STRENGTH: WNL  LYMPHEDEMA ASSESSMENTS:   LANDMARK RIGHT   eval  10 cm proximal to olecranon process 33  Olecranon process 27  10 cm proximal to ulnar styloid process 26.5  Just proximal to ulnar styloid process 17.1  Across hand at thumb web space 20.3  At base of 2nd digit 6.8  (Blank rows = not tested)  LANDMARK LEFT   eval  10 cm proximal to olecranon process 32.7  Olecranon process 26.9  10 cm proximal to ulnar styloid process 25.1  Just proximal to ulnar styloid process 17.3  Across hand at thumb web space 19.7  At base of 2nd digit 6.4   (Blank rows = not tested)  L-DEX LYMPHEDEMA SCREENING:  The patient was assessed using the L-Dex machine today to produce a lymphedema index baseline score. The patient will be reassessed on a regular basis (typically every 3 months) to obtain new L-Dex scores. If the score is > 6.5 points away from his/her baseline score indicating onset of subclinical lymphedema, it will be recommended to wear a compression garment for 4 weeks, 12 hours per day and then be reassessed. If the score continues to be > 6.5 points from baseline at reassessment, we will initiate lymphedema treatment. Assessing in this manner has a 95% rate of preventing clinically significant lymphedema.   L-DEX FLOWSHEETS - 01/17/23 1000       L-DEX LYMPHEDEMA SCREENING   Measurement Type Unilateral    L-DEX MEASUREMENT EXTREMITY Upper Extremity    POSITION  Standing    DOMINANT SIDE Right    At Risk Side Right    BASELINE SCORE (UNILATERAL) -1             QUICK DASH SURVEY:  Neldon Mc - 01/17/23 0001     Open a tight or new jar No difficulty    Do heavy household chores (wash walls, wash floors) No difficulty    Carry a shopping bag or briefcase No difficulty    Wash your back No difficulty    Use a knife to cut food No difficulty    Recreational activities in which you take some force or impact through your arm, shoulder, or hand (golf, hammering, tennis) No difficulty    During the past week, to what extent has your arm, shoulder or hand problem interfered with your normal social activities with family, friends, neighbors, or groups? Not at all    During the past week, to what extent has your arm, shoulder or hand problem limited your work or other regular daily activities Not at all  Arm, shoulder, or hand pain. None    Tingling (pins and needles) in your arm, shoulder, or hand None    Difficulty Sleeping No difficulty    DASH Score 0 %              PATIENT EDUCATION:  Education details:  Lymphedema risk reduction and post op shoulder/posture HEP Person educated: Patient Education method: Explanation, Demonstration, Handout Education comprehension: Patient verbalized understanding and returned demonstration  HOME EXERCISE PROGRAM: Patient was instructed today in a home exercise program today for post op shoulder range of motion. These included active assist shoulder flexion in sitting, scapular retraction, wall walking with shoulder abduction, and hands behind head external rotation.  She was encouraged to do these twice a day, holding 3 seconds and repeating 5 times when permitted by her physician.   ASSESSMENT:  CLINICAL IMPRESSION: Patient was diagnosed on 12/19/2022 with right grade 2 invasive ductal carcinoma breast cancer. It measures 8 mm and is located in the upper inner quadrant. It is ER/PR positive and HER2 negative with a Ki67 of 5%. Her multidisciplinary medical team met prior to her assessments to determine a recommended treatment plan. She is planning to have a right lumpectomy and sentinel node biopsy followed by Oncotype testing, radiation, and anti-estrogen therapy. She will benefit from a post op PT reassessment to determine needs and from L-Dex screens every 3 months for 2 years to detect subclinical lymphedema.  Pt will benefit from skilled therapeutic intervention to improve on the following deficits: Decreased knowledge of precautions, impaired UE functional use, pain, decreased ROM, postural dysfunction.   PT treatment/interventions: ADL/self-care home management, pt/family education, therapeutic exercise  REHAB POTENTIAL: Excellent  CLINICAL DECISION MAKING: Stable/uncomplicated  EVALUATION COMPLEXITY: Low   GOALS: Goals reviewed with patient? YES  LONG TERM GOALS: (STG=LTG)    Name Target Date Goal status  1 Pt will be able to verbalize understanding of pertinent lymphedema risk reduction practices relevant to her dx specifically related to skin  care.  Baseline:  No knowledge 01/17/2023 Achieved at eval  2 Pt will be able to return demo and/or verbalize understanding of the post op HEP related to regaining shoulder ROM. Baseline:  No knowledge 01/17/2023 Achieved at eval  3 Pt will be able to verbalize understanding of the importance of attending the post op After Breast CA Class for further lymphedema risk reduction education and therapeutic exercise.  Baseline:  No knowledge 01/17/2023 Achieved at eval  4 Pt will demo she has regained full shoulder ROM and function post operatively compared to baselines.  Baseline: See objective measurements taken today. 03/14/2023     PLAN:  PT FREQUENCY/DURATION: EVAL and 1 follow up appointment.   PLAN FOR NEXT SESSION: will reassess 3-4 weeks post op to determine needs.   Patient will follow up at outpatient cancer rehab 3-4 weeks following surgery.  If the patient requires physical therapy at that time, a specific plan will be dictated and sent to the referring physician for approval. The patient was educated today on appropriate basic range of motion exercises to begin post operatively and the importance of attending the After Breast Cancer class following surgery.  Patient was educated today on lymphedema risk reduction practices as it pertains to recommendations that will benefit the patient immediately following surgery.  She verbalized good understanding.    Physical Therapy Information for After Breast Cancer Surgery/Treatment:  Lymphedema is a swelling condition that you may be at risk for in your arm if you have  lymph nodes removed from the armpit area.  After a sentinel node biopsy, the risk is approximately 5-9% and is higher after an axillary node dissection.  There is treatment available for this condition and it is not life-threatening.  Contact your physician or physical therapist with concerns. You may begin the 4 shoulder/posture exercises (see additional sheet) when permitted by your  physician (typically a week after surgery).  If you have drains, you may need to wait until those are removed before beginning range of motion exercises.  A general recommendation is to not lift your arms above shoulder height until drains are removed.  These exercises should be done to your tolerance and gently.  This is not a "no pain/no gain" type of recovery so listen to your body and stretch into the range of motion that you can tolerate, stopping if you have pain.  If you are having immediate reconstruction, ask your plastic surgeon about doing exercises as he or she may want you to wait. We encourage you to attend the free one time ABC (After Breast Cancer) class offered by [facility].  You will learn information related to lymphedema risk, prevention and treatment and additional exercises to regain mobility following surgery.  You can call [phone] for more information.  This is offered the 1st and 3rd Monday of each month.  You only attend the class one time. While undergoing any medical procedure or treatment, try to avoid blood pressure being taken or needle sticks from occurring on the arm on the side of cancer.   This recommendation begins after surgery and continues for the rest of your life.  This may help reduce your risk of getting lymphedema (swelling in your arm). An excellent resource for those seeking information on lymphedema is the National Lymphedema Network's web site. It can be accessed at www.lymphnet.org If you notice swelling in your hand, arm or breast at any time following surgery (even if it is many years from now), please contact your doctor or physical therapist to discuss this.  Lymphedema can be treated at any time but it is easier for you if it is treated early on.  If you feel like your shoulder motion is not returning to normal in a reasonable amount of time, please contact your surgeon or physical therapist.  [facility] [phone]. [address], [place] [address]  ABC CLASS After Breast Cancer Class  After Breast Cancer Class is a specially designed exercise class to assist you in a safe recover after having breast cancer surgery.  In this class you will learn how to get back to full function whether your drains were just removed or if you had surgery a month ago.  This one-time class is held the 1st and 3rd Monday of every month from 11:00 a.m. until 12:00 noon virtually.  This class is FREE and space is limited. For more information or to register for the next available class, call [phone].  Class Goals  Understand specific stretches to improve the flexibility of you chest and shoulder. Learn ways to safely strengthen your upper body and improve your posture. Understand the warning signs of infection and why you may be at risk for an arm infection. Learn about Lymphedema and prevention.  ** You do not attend this class until after surgery.  Drains must be removed to participate  Patient was instructed today in a home exercise program today for post op shoulder  range of motion. These included active assist shoulder flexion in sitting, scapular retraction, wall walking with shoulder abduction, and hands behind head external rotation.  She was encouraged to do these twice a day, holding 3 seconds and repeating 5 times when permitted by her physician.  Bethann Punches, [place] 01/17/23 11:15 AM

## 2023-01-17 NOTE — Research (Signed)
Trial:  [id number]: A Phase III Adjuvant Trial Evaluating the Addition of Adjuvant Chemotherapy to Ovarian Function Suppression plus Endocrine Therapy in Premenopausal Patients with pN0-1, ER-Positive/HER2-Negative Breast Cancer and an Oncotype Recurrence Score ? 25 (OFSET) Patient Victoria Cunningham was identified by Dr Al Pimple as a potential candidate for the above listed study.  This Clinical Research Nurse met with Victoria Cunningham, [id number], on 01/17/23 in a manner and location that ensures patient privacy to discuss participation in the above listed research study.  Patient is Accompanied by her husband .  A copy of the informed consent document and separate HIPAA Authorization was provided to the patient.  Patient reads, speaks, and understands [place].   Patient was provided with the business card of this Nurse and encouraged to contact the research team with any questions.  Approximately 10 minutes were spent with the patient reviewing the informed consent documents.  Patient was provided the option of taking informed consent documents home to review and was encouraged to review at their convenience with their support network, including other care providers. Patient took the consent documents home to review. Victoria Chance Marcos Ruelas, RN, BSN, [facility] She  Her  Hers Clinical Research Nurse [facility] Direct Dial [phone]  Pager [phone] 01/17/2023 3:09 PM

## 2023-01-17 NOTE — Progress Notes (Signed)
[facility] CONSULT NOTE  Patient Care Team: [facility] as PCP - General Donnelly Angelica, RN as Oncology Nurse Navigator Lu Duffel, Margretta Ditty, RN as Oncology Nurse Navigator Rachel Moulds, MD as Consulting Physician (Hematology and Oncology) Dorothy Puffer, MD as Consulting Physician (Radiation Oncology) Harriette Bouillon, MD as Consulting Physician (General Surgery)  CHIEF COMPLAINTS/PURPOSE OF CONSULTATION:  Newly diagnosed breast cancer  HISTORY OF PRESENTING ILLNESS:  Victoria Cunningham 47 y.o. female is here because of recent diagnosis of right breast IDC  I reviewed her records extensively and collaborated the history with the patient.  SUMMARY OF ONCOLOGIC HISTORY: Oncology History  Malignant neoplasm of upper-inner quadrant of right breast in female, estrogen receptor positive (HCC)  01/03/2023 Mammogram   Suspicious 1.5 cm mass in the upper inner quadrant of the right breast. Targeted ultrasound is performed, showing an irregular hypoechoic mass with shadowing in the right breast at 2 o'clock 5 cm from the nipple measuring 8 x 7 x 8 mm. Sonographic evaluation of the right axilla does not show any enlarged adenopathy.     01/05/2023 Pathology Results   Pathology from the right breast needle core biopsy showed invasive ductal carcinoma grade 2, DCIS grade 2, prognostic showed ER 90% positive moderate to strong staining PR 100% positive strong staining, Ki-67 of 5%, HER2 0+   01/16/2023 Initial Diagnosis   Malignant neoplasm of upper-inner quadrant of right breast in female, estrogen receptor positive (HCC)    This is a very pleasant 47 year old premenopausal female patient with no significant past medical history on Mirena IUD referred to breast [facility] given screen mammogram detected breast cancer.  She is very healthy at baseline.  She has 4 children, breast-fed all of them for approximately 2 years each.  No family history of breast cancer.  Family  history significant for AL L and dad who had bone marrow transplant.  She denies any major medical issues.  Rest of the pertinent 10 point ROS reviewed and negative  MEDICAL HISTORY:  Past Medical History:  Diagnosis Date   No pertinent past medical history    PONV (postoperative nausea and vomiting)     SURGICAL HISTORY: Past Surgical History:  Procedure Laterality Date   BREAST BIOPSY Right 01/05/2023   [place] RT BREAST BX W LOC DEV 1ST LESION IMG BX SPEC US GUIDE 01/05/2023 [facility]   lapriscopic     2009 r/o endometriosis    SOCIAL HISTORY: Social History   Socioeconomic History   Marital status: Married    Spouse name: Not on file   Number of children: Not on file   Years of education: Not on file   Highest education level: Not on file  Occupational History   Not on file  Tobacco Use   Smoking status: Never   Smokeless tobacco: Never  Vaping Use   Vaping status: Never Used  Substance and Sexual Activity   Alcohol use: No   Drug use: No   Sexual activity: Yes    Birth control/protection: None  Other Topics Concern   Not on file  Social History Narrative   Not on file   Social Determinants of Health   Financial Resource Strain: Not on file  Food Insecurity: No Food Insecurity (01/03/2022)   Received from [facility]   Hunger Vital Sign    Worried About Running Out of Food in the Last Year: Never true    Ran Out of Food in the Last Year: Never  true  Transportation Needs: Not on file  Physical Activity: Not on file  Stress: Not on file  Social Connections: Unknown (12/29/2021)   Received from [facility]   Social Network    Social Network: Not on file  Intimate Partner Violence: Unknown (12/29/2021)   Received from [facility]   HITS    Physically Hurt: Not on file    Insult or Talk Down To: Not on file    Threaten Physical Harm: Not on file    Scream or Curse: Not on file    FAMILY HISTORY: Family History  Problem Relation Age of Onset    Heart disease Father    Diabetes Father    Cancer Father        leaukemia   Cancer Maternal Grandfather    Cancer Paternal Grandfather    Anesthesia problems Neg Hx     ALLERGIES:  has No Known Allergies.  MEDICATIONS:  Current Outpatient Medications  Medication Sig Dispense Refill   ibuprofen (ADVIL,MOTRIN) 800 MG tablet Take 1 tablet (800 mg total) by mouth every 8 (eight) hours as needed for moderate pain. 30 tablet 1   meclizine (ANTIVERT) 25 MG tablet Take 1 tablet (25 mg total) by mouth 3 (three) times daily as needed for dizziness. 30 tablet 0   meloxicam (MOBIC) 7.5 MG tablet Take 7.5 mg by mouth daily.     ondansetron (ZOFRAN) 4 MG tablet Take 1 tablet (4 mg total) by mouth every 8 (eight) hours as needed for nausea or vomiting. 4 tablet 0   predniSONE (DELTASONE) 5 MG tablet Take 6 pills for first day, 5 pills second day, 4 pills third day, 3 pills fourth day, 2 pills the fifth day, and 1 pill sixth day. 21 tablet 0   Prenatal Vit-Fe Fumarate-FA (PRENATAL MULTIVITAMIN) TABS Take 1 tablet by mouth at bedtime.      No current facility-administered medications for this visit.    REVIEW OF SYSTEMS:   Constitutional: Denies fevers, chills or abnormal night sweats Eyes: Denies blurriness of vision, double vision or watery eyes Ears, nose, mouth, throat, and face: Denies mucositis or sore throat Respiratory: Denies cough, dyspnea or wheezes Cardiovascular: Denies palpitation, chest discomfort or lower extremity swelling Gastrointestinal:  Denies nausea, heartburn or change in bowel habits Skin: Denies abnormal skin rashes Lymphatics: Denies new lymphadenopathy or easy bruising Neurological:Denies numbness, tingling or new weaknesses Behavioral/Psych: Mood is stable, no new changes  Breast: Denies any palpable lumps or discharge All other systems were reviewed with the patient and are negative.  PHYSICAL EXAMINATION: ECOG PERFORMANCE STATUS: 0 - Asymptomatic  Vitals:    01/17/23 0900  BP: 123/63  Pulse: 76  Resp: 18  Temp: 97.7 F (36.5 C)  SpO2: 100%   Filed Weights   01/17/23 0900  Weight: 230 lb 6.4 oz (104.5 kg)    GENERAL:alert, no distress and comfortable SKIN: skin color, texture, turgor are normal, no rashes or significant lesions EYES: normal, conjunctiva are pink and non-injected, sclera clear OROPHARYNX:no exudate, no erythema and lips, buccal mucosa, and tongue normal  NECK: supple, thyroid normal size, non-tender, without nodularity LYMPH:  no palpable lymphadenopathy in the cervical, axillary LUNGS: clear to auscultation and percussion with normal breathing effort HEART: regular rate & rhythm and no murmurs and no lower extremity edema ABDOMEN:abdomen soft, non-tender and normal bowel sounds Musculoskeletal:no cyanosis of digits and no clubbing  PSYCH: alert & oriented x 3 with fluent speech NEURO: no focal motor/sensory deficits BREAST: No definitive palpable  mass.  No palpable regional adenopathy  LABORATORY DATA:  I have reviewed the data as listed Lab Results  Component Value Date   WBC 5.8 01/17/2023   HGB 14.1 01/17/2023   HCT 42.1 01/17/2023   MCV 86.6 01/17/2023   PLT 239 01/17/2023   Lab Results  Component Value Date   NA 138 12/28/2021   K 4.1 12/28/2021   CL 111 12/28/2021   CO2 21 (L) 12/28/2021    RADIOGRAPHIC STUDIES: I have personally reviewed the radiological reports and agreed with the findings in the report.  ASSESSMENT AND PLAN:  Malignant neoplasm of upper-inner quadrant of right breast in female, estrogen receptor positive (HCC) This is a very pleasant 47 year old female patient with newly diagnosed right breast invasive ductal carcinoma, grade 2, ER/PR positive, HER2 negative referred to breast MDC for additional recommendations.  Given small tumor measuring 8 x 7 x 8 mm, node-negative T, strong ER/PR positivity she will proceed with upfront surgery followed by consideration for Oncotype DX  testing.  If she does have Oncotype DX score between 21-25 or if her score falls between 16-20 and high clinical risk, then we will consider her for offset trial.  I have discussed about offset trial today in detail which randomizes patients to chemotherapy versus ovarian function suppression if there eligible.  She is very willing to consider offset if she is a right candidate for this.  If Oncotype DX is low and does not warrant any chemotherapy she will then proceed with adjuvant radiation followed by antiestrogen therapy.  Given her premenopausal status have discussed about tamoxifen today.  We have discussed options for antiestrogen therapy today. With regards to Tamoxifen, we discussed that this is a SERM, selective estrogen receptor modulator. We discussed mechanism of action of Tamoxifen, adverse effects on Tamoxifen including but not limited to post menopausal symptoms, increased risk of DVT/PE, increased risk of endometrial cancer, questionable cataracts with long term use and increased risk of cardiovascular events in the study which was not statistically significant. A benefit from Tamoxifen would be improvement in bone density.  She will return to clinic after surgery to review final pathology and Oncotype DX results and to discuss any additional adjuvant recommendations.  Genetic testing recommended.  She should also consider getting the Mirena removed and replace it with copper IUD.     All questions were answered. The patient knows to call the clinic with any problems, questions or concerns.    Rachel Moulds, MD 01/17/23

## 2023-01-18 ENCOUNTER — Encounter: Payer: Self-pay | Admitting: Genetic Counselor

## 2023-01-18 NOTE — Progress Notes (Signed)
REFERRING PROVIDER: Rachel Moulds, MD  PRIMARY PROVIDER:  [facility]  PRIMARY REASON FOR VISIT:  1. Malignant neoplasm of upper-inner quadrant of right breast in female, estrogen receptor positive (HCC)   2. Family history of breast cancer    HISTORY OF PRESENT ILLNESS:   [name], a 47 y.o. female, was seen for a [place] cancer genetics consultation during the breast multidisciplinary clinic at the request of Dr. Al Pimple due to a personal and family history of cancer.  [name] presents to clinic today to discuss the possibility of a hereditary predisposition to cancer, to discuss genetic testing, and to further clarify her future cancer risks, as well as potential cancer risks for family members.   In August 2024, at the age of 9, 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   CANCER HISTORY:  Oncology History  Malignant neoplasm of upper-inner quadrant of right breast in female, estrogen receptor positive (HCC)  01/03/2023 Mammogram   Suspicious 1.5 cm mass in the upper inner quadrant of the right breast. Targeted ultrasound is performed, showing an irregular hypoechoic mass with shadowing in the right breast at 2 o'clock 5 cm from the nipple measuring 8 x 7 x 8 mm. Sonographic evaluation of the right axilla does not show any enlarged adenopathy.     01/05/2023 Pathology Results   Pathology from the right breast needle core biopsy showed invasive ductal carcinoma grade 2, DCIS grade 2, prognostic showed ER 90% positive moderate to strong staining PR 100% positive strong staining, Ki-67 of 5%, HER2 0+   01/16/2023 Initial Diagnosis   Malignant neoplasm of upper-inner quadrant of right breast in female, estrogen receptor positive (HCC)      RISK FACTORS:  Menarche was at age 61.  First live birth at age 75.  OCP use for approximately 0 years.  Ovaries intact: yes.  Uterus intact: yes.   Menopausal status: premenopausal.  HRT use: 0 years. Colonoscopy: 2006;  no polyps . Mammogram within the last year: yes. Any excessive radiation exposure in the past: no  Past Medical History:  Diagnosis Date   No pertinent past medical history    PONV (postoperative nausea and vomiting)     Past Surgical History:  Procedure Laterality Date   BREAST BIOPSY Right 01/05/2023   [place] RT BREAST BX W LOC DEV 1ST LESION IMG BX SPEC US GUIDE 01/05/2023 [facility]   lapriscopic     2009 r/o endometriosis    Social History   Socioeconomic History   Marital status: Married    Spouse name: Not on file   Number of children: Not on file   Years of education: Not on file   Highest education level: Not on file  Occupational History   Not on file  Tobacco Use   Smoking status: Never   Smokeless tobacco: Never  Vaping Use   Vaping status: Never Used  Substance and Sexual Activity   Alcohol use: No   Drug use: No   Sexual activity: Yes    Birth control/protection: None  Other Topics Concern   Not on file  Social History Narrative   Not on file   Social Determinants of Health   Financial Resource Strain: Not on file  Food Insecurity: No Food Insecurity (01/03/2022)   Received from [facility]   Hunger Vital Sign    Worried About Running Out of Food in the Last Year: Never true  Ran Out of Food in the Last Year: Never true  Transportation Needs: Not on file  Physical Activity: Not on file  Stress: Not on file  Social Connections: Unknown (12/29/2021)   Received from [facility]   Social Network    Social Network: Not on file     FAMILY HISTORY:  We obtained a detailed, 4-generation family history.  Significant diagnoses are listed below: Family History  Problem Relation Age of Onset   Heart disease Father    Diabetes Father    Acute lymphoblastic leukemia Father 75   Lung cancer Paternal Aunt    Breast cancer Paternal Great-grandmother 83 - 99   Anesthesia  problems Neg Hx        Ms. Cavanna is unaware of previous family history of genetic testing for hereditary cancer risks. There is no reported Ashkenazi Jewish ancestry.   GENETIC COUNSELING ASSESSMENT: [name] is a 47 y.o. female with a personal and family history of cancer which is somewhat suggestive of a hereditary cancer syndrome and predisposition to cancer given her young age at diagnosis (<50). We, therefore, discussed and recommended the following at today's visit.   DISCUSSION: We discussed that 5 - 10% of cancer is hereditary, with most cases of hereditary breast cancer associated with mutations in BRCA1/2.  There are other genes that can be associated with hereditary breast cancer syndromes. Type of cancer risk and level of risk are gene-specific. We discussed that testing is beneficial for several reasons including knowing how to follow individuals after completing their treatment, identifying whether potential treatment options would be beneficial, and understanding if other family members could be at risk for cancer and allowing them to undergo genetic testing.   We reviewed the characteristics, features and inheritance patterns of hereditary cancer syndromes. We also discussed genetic testing, including the appropriate family members to test, the process of testing, insurance coverage and turn-around-time for results. We discussed the implications of a negative, positive and/or variant of uncertain significant result. In order to get genetic test results in a timely manner so that Ms. Hellberg can use these genetic test results for surgical decisions, we recommended Ms. Picou pursue genetic testing for the BRCAplus panel. Once complete, we recommend Ms. Luckenbach pursue reflex genetic testing to a more comprehensive gene panel.   Ms. Beneke elected to have [facility] CancerNext Panel. The CancerNext gene panel offered by [employer] includes sequencing, rearrangement analysis, and RNA analysis for  the following 34 genes:   APC, ATM, AXIN2, BARD1, BMPR1A, BRCA1, BRCA2, BRIP1, CDH1, CDK4, CDKN2A, CHEK2, DICER1, HOXB13, EPCAM, GREM1, MLH1, MSH2, MSH3, MSH6, MUTYH, NF1, NTHL1, PALB2, PMS2, POLD1, POLE, PTEN, RAD51C, RAD51D, SMAD4, SMARCA4, STK11, and TP53.   Based on Ms. Mackiewicz's personal and family history of cancer, she meets medical criteria for genetic testing. Despite that she meets criteria, she may still have an out of pocket cost. We discussed that if her out of pocket cost for testing is over $100, the laboratory should contact them to discuss self-pay prices, patient pay assistance programs, if applicable, and other billing options.   PLAN: After considering the risks, benefits, and limitations, Ms. Callender provided informed consent to pursue genetic testing and the blood sample was sent to [facility] for analysis of the CancerNext Panel. Results should be available within approximately 1-2 weeks' time, at which point they will be disclosed by telephone to Ms. Medellin, as will any additional recommendations warranted by these results. Ms. Muldowney will receive a summary of her  genetic counseling visit and a copy of her results once available. This information will also be available in Epic.   Ms. Siebers's questions were answered to her satisfaction today. Our contact information was provided should additional questions or concerns arise. Thank you for the referral and allowing [place] to share in the care of your patient.   Lalla Brothers, MS, [facility] Genetic Counselor [email or web address] (P) [phone]  The patient was seen for a total of 20 minutes in face-to-face genetic counseling.  The patient brought her husband. Drs. Pamelia Hoit and/or Mosetta Putt were available to discuss this case as needed.  _______________________________________________________________________ For Office Staff:  Number of people involved in session: 2 Was an Intern/ student involved with case: no

## 2023-01-19 ENCOUNTER — Other Ambulatory Visit: Payer: Self-pay | Admitting: Surgery

## 2023-01-19 DIAGNOSIS — C50911 Malignant neoplasm of unspecified site of right female breast: Secondary | ICD-10-CM

## 2023-01-22 ENCOUNTER — Encounter: Payer: Self-pay | Admitting: General Practice

## 2023-01-22 NOTE — Progress Notes (Signed)
[facility] Spiritual Care Note  Followed up with Sherl by phone for pastoral check-in. She is in good spirits overall, though understandably feeling some nervousness about surgery. She reports very strong support and feels uplifted. We talked in particular about how focusing on one's learning, growth, and transformation through treatment can be a powerful pivot from loss of control to restoration of personal agency.  We plan to follow up by phone after her surgery, and Rayna knows to contact chaplain in the meantime as needed/desired.   [address] [place], [place], [facility] Pager [phone] Voicemail [phone]

## 2023-01-24 ENCOUNTER — Telehealth: Payer: Self-pay | Admitting: *Deleted

## 2023-01-24 ENCOUNTER — Encounter: Payer: Self-pay | Admitting: *Deleted

## 2023-01-24 NOTE — Telephone Encounter (Signed)
Left message for a return phone call to follow up from [facility] 9/4 and assess navigation needs.

## 2023-01-26 ENCOUNTER — Telehealth: Payer: Self-pay | Admitting: Hematology and Oncology

## 2023-01-26 NOTE — Telephone Encounter (Signed)
Left patient a message regarding scheduled appointment times/dates

## 2023-01-30 ENCOUNTER — Other Ambulatory Visit: Payer: Self-pay

## 2023-01-30 ENCOUNTER — Encounter (HOSPITAL_BASED_OUTPATIENT_CLINIC_OR_DEPARTMENT_OTHER): Payer: Self-pay | Admitting: Surgery

## 2023-02-01 ENCOUNTER — Encounter: Payer: Self-pay | Admitting: Genetic Counselor

## 2023-02-01 ENCOUNTER — Telehealth: Payer: Self-pay | Admitting: Genetic Counselor

## 2023-02-01 DIAGNOSIS — Z1379 Encounter for other screening for genetic and chromosomal anomalies: Secondary | ICD-10-CM | POA: Insufficient documentation

## 2023-02-01 NOTE — Telephone Encounter (Signed)
I contacted [name] to discuss her genetic testing results. No pathogenic variants were identified in the first 13 genes analyzed. Of note, we are still waiting on the pan cancer panel. Detailed clinic note to follow.  The test report has been scanned into EPIC and is located under the Molecular Pathology section of the Results Review tab.  A portion of the result report is included below for reference.   Lalla Brothers, MS, [facility] Genetic Counselor [email or web address] (P) [phone]

## 2023-02-06 ENCOUNTER — Ambulatory Visit
Admission: RE | Admit: 2023-02-06 | Discharge: 2023-02-06 | Disposition: A | Discharge status: Home / Self Care | Payer: 59 | Source: Ambulatory Visit | Attending: Surgery | Admitting: Surgery

## 2023-02-06 DIAGNOSIS — C50911 Malignant neoplasm of unspecified site of right female breast: Secondary | ICD-10-CM

## 2023-02-06 HISTORY — PX: BREAST BIOPSY: SHX20

## 2023-02-06 MED ORDER — CHLORHEXIDINE GLUCONATE CLOTH 2 % EX PADS: 6.0000 | MEDICATED_PAD | Freq: Once | CUTANEOUS | Status: DC

## 2023-02-06 NOTE — Progress Notes (Signed)

## 2023-02-06 NOTE — Anesthesia Preprocedure Evaluation (Signed)
Anesthesia Evaluation  Patient identified by MRN, date of birth, ID band Patient awake    Reviewed: Allergy & Precautions, H&P , NPO status , Patient's Chart, lab work & pertinent test results  History of Anesthesia Complications (+) PONV and history of anesthetic complications  Airway Mallampati: I  TM Distance: >3 FB Neck ROM: Full    Dental no notable dental hx. (+) Implants, Teeth Intact, Dental Advisory Given   Pulmonary neg pulmonary ROS   Pulmonary exam normal breath sounds clear to auscultation       Cardiovascular negative cardio ROS Normal cardiovascular exam Rhythm:Regular Rate:Normal     Neuro/Psych negative neurological ROS  negative psych ROS   GI/Hepatic negative GI ROS, Neg liver ROS,,,  Endo/Other  negative endocrine ROS    Renal/GU negative Renal ROS K+ 3.8  negative genitourinary   Musculoskeletal negative musculoskeletal ROS (+)    Abdominal  (+) + obese  Peds negative pediatric ROS (+)  Hematology negative hematology ROS (+)   Anesthesia Other Findings NKDA  R Breast MAss  Reproductive/Obstetrics negative OB ROS                              Anesthesia Physical Anesthesia Plan  ASA: 3  Anesthesia Plan: Regional and General   Post-op Pain Management: Regional block*, Minimal or no pain anticipated, Tylenol PO (pre-op)* and Precedex   Induction: Intravenous  PONV Risk Score and Plan: 4 or greater and Treatment may vary due to age or medical condition, Midazolam, Ondansetron, Dexamethasone and TIVA  Airway Management Planned: LMA  Additional Equipment: None  Intra-op Plan:   Post-operative Plan:   Informed Consent:      Dental advisory given  Plan Discussed with:   Anesthesia Plan Comments: (LMA TIVA  W R Pec Block)         Anesthesia Quick Evaluation

## 2023-02-07 ENCOUNTER — Ambulatory Visit
Admission: RE | Admit: 2023-02-07 | Discharge: 2023-02-07 | Disposition: A | Discharge status: Home / Self Care | Payer: 59 | Source: Ambulatory Visit | Attending: Surgery | Admitting: Surgery

## 2023-02-07 ENCOUNTER — Other Ambulatory Visit: Payer: Self-pay

## 2023-02-07 ENCOUNTER — Encounter (HOSPITAL_BASED_OUTPATIENT_CLINIC_OR_DEPARTMENT_OTHER)
Admission: RE | Disposition: A | Discharge status: Home / Self Care | Payer: Self-pay | Source: Home / Self Care | Attending: Surgery

## 2023-02-07 ENCOUNTER — Ambulatory Visit (HOSPITAL_BASED_OUTPATIENT_CLINIC_OR_DEPARTMENT_OTHER)
Admission: RE | Admit: 2023-02-07 | Discharge: 2023-02-07 | Disposition: A | Discharge status: Home / Self Care | Payer: 59 | Attending: Surgery | Admitting: Surgery

## 2023-02-07 ENCOUNTER — Encounter (HOSPITAL_BASED_OUTPATIENT_CLINIC_OR_DEPARTMENT_OTHER): Payer: Self-pay | Admitting: Surgery

## 2023-02-07 ENCOUNTER — Ambulatory Visit (HOSPITAL_BASED_OUTPATIENT_CLINIC_OR_DEPARTMENT_OTHER): Payer: 59 | Admitting: Certified Registered"

## 2023-02-07 DIAGNOSIS — Z170 Estrogen receptor positive status [ER+]: Secondary | ICD-10-CM | POA: Diagnosis not present

## 2023-02-07 DIAGNOSIS — Z171 Estrogen receptor negative status [ER-]: Secondary | ICD-10-CM | POA: Diagnosis not present

## 2023-02-07 DIAGNOSIS — Z793 Long term (current) use of hormonal contraceptives: Secondary | ICD-10-CM | POA: Diagnosis not present

## 2023-02-07 DIAGNOSIS — C50211 Malignant neoplasm of upper-inner quadrant of right female breast: Secondary | ICD-10-CM | POA: Diagnosis present

## 2023-02-07 DIAGNOSIS — C50911 Malignant neoplasm of unspecified site of right female breast: Secondary | ICD-10-CM

## 2023-02-07 DIAGNOSIS — D0581 Other specified type of carcinoma in situ of right breast: Secondary | ICD-10-CM | POA: Diagnosis not present

## 2023-02-07 DIAGNOSIS — Z01818 Encounter for other preprocedural examination: Secondary | ICD-10-CM

## 2023-02-07 HISTORY — DX: Malignant (primary) neoplasm, unspecified: C80.1

## 2023-02-07 HISTORY — PX: BREAST LUMPECTOMY WITH RADIOACTIVE SEED AND SENTINEL LYMPH NODE BIOPSY: SHX6550

## 2023-02-07 LAB — POCT PREGNANCY, URINE: Preg Test, Ur: NEGATIVE

## 2023-02-07 SURGERY — BREAST LUMPECTOMY WITH RADIOACTIVE SEED AND SENTINEL LYMPH NODE BIOPSY
Anesthesia: Regional | Site: Breast | Laterality: Right

## 2023-02-07 MED ORDER — BUPIVACAINE HCL (PF) 0.5 % IJ SOLN
INTRAMUSCULAR | Status: DC | PRN
Start: 2023-02-07 — End: 2023-02-07
  Administered 2023-02-07: 20 mL

## 2023-02-07 MED ORDER — LIDOCAINE 2% (20 MG/ML) 5 ML SYRINGE
INTRAMUSCULAR | Status: AC
  Filled 2023-02-07: qty 5

## 2023-02-07 MED ORDER — BUPIVACAINE-EPINEPHRINE (PF) 0.25% -1:200000 IJ SOLN
INTRAMUSCULAR | Status: AC
  Filled 2023-02-07: qty 60

## 2023-02-07 MED ORDER — FENTANYL CITRATE (PF) 100 MCG/2ML IJ SOLN
100.0000 ug | Freq: Once | INTRAMUSCULAR | Status: AC
  Administered 2023-02-07: 100 ug via INTRAVENOUS

## 2023-02-07 MED ORDER — ONDANSETRON HCL 4 MG/2ML IJ SOLN
INTRAMUSCULAR | Status: DC | PRN
  Administered 2023-02-07: 4 mg via INTRAVENOUS

## 2023-02-07 MED ORDER — FENTANYL CITRATE (PF) 100 MCG/2ML IJ SOLN
INTRAMUSCULAR | Status: AC
  Filled 2023-02-07: qty 2

## 2023-02-07 MED ORDER — HEMOSTATIC AGENTS (NO CHARGE) OPTIME
TOPICAL | Status: DC | PRN
Start: 2023-02-07 — End: 2023-02-07
  Administered 2023-02-07: 1 via TOPICAL

## 2023-02-07 MED ORDER — CEFAZOLIN SODIUM-DEXTROSE 2-4 GM/100ML-% IV SOLN
2.0000 g | INTRAVENOUS | Status: AC
  Administered 2023-02-07: 2 g via INTRAVENOUS

## 2023-02-07 MED ORDER — OXYCODONE HCL 5 MG/5ML PO SOLN: 5.0000 mg | Freq: Once | ORAL | Status: DC | PRN

## 2023-02-07 MED ORDER — LACTATED RINGERS IV SOLN: INTRAVENOUS | Status: DC

## 2023-02-07 MED ORDER — MIDAZOLAM HCL 2 MG/2ML IJ SOLN
2.0000 mg | Freq: Once | INTRAMUSCULAR | Status: AC
  Administered 2023-02-07: 2 mg via INTRAVENOUS

## 2023-02-07 MED ORDER — IBUPROFEN 800 MG PO TABS: 800.0000 mg | ORAL_TABLET | Freq: Three times a day (TID) | ORAL | 0 refills | Status: DC | PRN

## 2023-02-07 MED ORDER — SCOPOLAMINE 1 MG/3DAYS TD PT72
1.0000 | MEDICATED_PATCH | TRANSDERMAL | Status: DC
  Administered 2023-02-07: 1.5 mg via TRANSDERMAL

## 2023-02-07 MED ORDER — CEFAZOLIN SODIUM-DEXTROSE 2-4 GM/100ML-% IV SOLN
INTRAVENOUS | Status: AC
  Filled 2023-02-07: qty 100

## 2023-02-07 MED ORDER — DEXAMETHASONE SODIUM PHOSPHATE 10 MG/ML IJ SOLN
INTRAMUSCULAR | Status: DC | PRN
  Administered 2023-02-07: 5 mg via INTRAVENOUS

## 2023-02-07 MED ORDER — KETOROLAC TROMETHAMINE 30 MG/ML IJ SOLN: 30.0000 mg | Freq: Once | INTRAMUSCULAR | Status: DC | PRN

## 2023-02-07 MED ORDER — SODIUM CHLORIDE 0.9 % IV SOLN
INTRAVENOUS | Status: DC | PRN
  Administered 2023-02-07: 400 mL

## 2023-02-07 MED ORDER — BUPIVACAINE LIPOSOME 1.3 % IJ SUSP
INTRAMUSCULAR | Status: DC | PRN
Start: 2023-02-07 — End: 2023-02-07
  Administered 2023-02-07: 10 mL

## 2023-02-07 MED ORDER — BUPIVACAINE-EPINEPHRINE (PF) 0.25% -1:200000 IJ SOLN
INTRAMUSCULAR | Status: DC | PRN
  Administered 2023-02-07: 20 mL

## 2023-02-07 MED ORDER — ONDANSETRON HCL 4 MG/2ML IJ SOLN
INTRAMUSCULAR | Status: AC
  Filled 2023-02-07: qty 2

## 2023-02-07 MED ORDER — OXYCODONE HCL 5 MG PO TABS: 5.0000 mg | ORAL_TABLET | Freq: Once | ORAL | Status: DC | PRN

## 2023-02-07 MED ORDER — HYDROMORPHONE HCL 1 MG/ML IJ SOLN: 0.2500 mg | INTRAMUSCULAR | Status: DC | PRN

## 2023-02-07 MED ORDER — ACETAMINOPHEN 500 MG PO TABS
1000.0000 mg | ORAL_TABLET | ORAL | Status: AC
  Administered 2023-02-07: 1000 mg via ORAL

## 2023-02-07 MED ORDER — SODIUM CHLORIDE 0.9 % IV SOLN
INTRAVENOUS | Status: AC
  Filled 2023-02-07: qty 10

## 2023-02-07 MED ORDER — PROPOFOL 10 MG/ML IV BOLUS
INTRAVENOUS | Status: AC
  Filled 2023-02-07: qty 20

## 2023-02-07 MED ORDER — OXYCODONE HCL 5 MG PO TABS: 5.0000 mg | ORAL_TABLET | Freq: Four times a day (QID) | ORAL | 0 refills | Status: DC | PRN

## 2023-02-07 MED ORDER — DEXMEDETOMIDINE HCL IN NACL 80 MCG/20ML IV SOLN
INTRAVENOUS | Status: DC | PRN
  Administered 2023-02-07: 12 ug via INTRAVENOUS

## 2023-02-07 MED ORDER — SCOPOLAMINE 1 MG/3DAYS TD PT72
MEDICATED_PATCH | TRANSDERMAL | Status: AC
  Filled 2023-02-07: qty 1

## 2023-02-07 MED ORDER — MAGTRACE LYMPHATIC TRACER
INTRAMUSCULAR | Status: DC | PRN
  Administered 2023-02-07: 2 mL via INTRAMUSCULAR

## 2023-02-07 MED ORDER — PROPOFOL 10 MG/ML IV BOLUS
INTRAVENOUS | Status: DC | PRN
  Administered 2023-02-07: 200 mg via INTRAVENOUS

## 2023-02-07 MED ORDER — LIDOCAINE HCL (CARDIAC) PF 100 MG/5ML IV SOSY
PREFILLED_SYRINGE | INTRAVENOUS | Status: DC | PRN
  Administered 2023-02-07: 50 mg via INTRAVENOUS

## 2023-02-07 MED ORDER — MIDAZOLAM HCL 2 MG/2ML IJ SOLN
INTRAMUSCULAR | Status: AC
  Filled 2023-02-07: qty 2

## 2023-02-07 MED ORDER — ACETAMINOPHEN 500 MG PO TABS
ORAL_TABLET | ORAL | Status: AC
  Filled 2023-02-07: qty 2

## 2023-02-07 MED ORDER — DEXAMETHASONE SODIUM PHOSPHATE 10 MG/ML IJ SOLN
INTRAMUSCULAR | Status: AC
  Filled 2023-02-07: qty 1

## 2023-02-07 MED ORDER — FENTANYL CITRATE (PF) 100 MCG/2ML IJ SOLN
INTRAMUSCULAR | Status: DC | PRN
  Administered 2023-02-07: 25 ug via INTRAVENOUS
  Administered 2023-02-07: 50 ug via INTRAVENOUS
  Administered 2023-02-07: 25 ug via INTRAVENOUS

## 2023-02-07 MED ORDER — PROPOFOL 500 MG/50ML IV EMUL
INTRAVENOUS | Status: AC
  Filled 2023-02-07: qty 50

## 2023-02-07 MED ORDER — ONDANSETRON HCL 4 MG/2ML IJ SOLN: 4.0000 mg | Freq: Once | INTRAMUSCULAR | Status: DC | PRN

## 2023-02-07 MED ORDER — PROPOFOL 500 MG/50ML IV EMUL
INTRAVENOUS | Status: DC | PRN
Start: 2023-02-07 — End: 2023-02-07
  Administered 2023-02-07: 175 ug/kg/min via INTRAVENOUS
  Administered 2023-02-07: 150 ug/kg/min via INTRAVENOUS

## 2023-02-07 SURGICAL SUPPLY — 50 items
ADH SKN CLS APL DERMABOND .7 (GAUZE/BANDAGES/DRESSINGS) ×1
APL PRP STRL LF DISP 70% ISPRP (MISCELLANEOUS) ×1
APPLIER CLIP 9.375 MED OPEN (MISCELLANEOUS) ×1
APR CLP MED 9.3 20 MLT OPN (MISCELLANEOUS) ×1
BAG DECANTER FOR FLEXI CONT (MISCELLANEOUS) IMPLANT
BINDER BREAST LRG (GAUZE/BANDAGES/DRESSINGS) IMPLANT
BINDER BREAST MEDIUM (GAUZE/BANDAGES/DRESSINGS) IMPLANT
BINDER BREAST XLRG (GAUZE/BANDAGES/DRESSINGS) IMPLANT
BINDER BREAST XXLRG (GAUZE/BANDAGES/DRESSINGS) IMPLANT
BLADE SURG 15 STRL LF DISP TIS (BLADE) ×1 IMPLANT
BLADE SURG 15 STRL SS (BLADE) ×1
CANISTER SUC SOCK COL 7IN (MISCELLANEOUS) IMPLANT
CANISTER SUCT 1200ML W/VALVE (MISCELLANEOUS) ×1 IMPLANT
CHLORAPREP W/TINT 26 (MISCELLANEOUS) ×1 IMPLANT
CLIP APPLIE 9.375 MED OPEN (MISCELLANEOUS) ×1 IMPLANT
COVER BACK TABLE 60X90IN (DRAPES) ×1 IMPLANT
COVER MAYO STAND STRL (DRAPES) ×1 IMPLANT
COVER PROBE CYLINDRICAL 5X96 (MISCELLANEOUS) ×1 IMPLANT
DERMABOND ADVANCED .7 DNX12 (GAUZE/BANDAGES/DRESSINGS) ×1 IMPLANT
DRAPE LAPAROSCOPIC ABDOMINAL (DRAPES) ×1 IMPLANT
DRAPE UTILITY XL STRL (DRAPES) ×1 IMPLANT
ELECT COATED BLADE 2.86 ST (ELECTRODE) ×1 IMPLANT
ELECT REM PT RETURN 9FT ADLT (ELECTROSURGICAL) ×1
ELECTRODE REM PT RTRN 9FT ADLT (ELECTROSURGICAL) ×1 IMPLANT
GLOVE BIOGEL PI IND STRL 8 (GLOVE) ×1 IMPLANT
GLOVE ECLIPSE 8.0 STRL XLNG CF (GLOVE) ×1 IMPLANT
GOWN STRL REUS W/ TWL LRG LVL3 (GOWN DISPOSABLE) ×2 IMPLANT
GOWN STRL REUS W/ TWL XL LVL3 (GOWN DISPOSABLE) ×1 IMPLANT
GOWN STRL REUS W/TWL LRG LVL3 (GOWN DISPOSABLE) ×2
GOWN STRL REUS W/TWL XL LVL3 (GOWN DISPOSABLE) ×1
HEMOSTAT ARISTA ABSORB 3G PWDR (HEMOSTASIS) IMPLANT
HEMOSTAT SNOW SURGICEL 2X4 (HEMOSTASIS) IMPLANT
KIT MARKER MARGIN INK (KITS) ×1 IMPLANT
NDL HYPO 25X1 1.5 SAFETY (NEEDLE) ×1 IMPLANT
NDL SAFETY ECLIP 18X1.5 (MISCELLANEOUS) IMPLANT
NEEDLE HYPO 25X1 1.5 SAFETY (NEEDLE) ×1
NS IRRIG 1000ML POUR BTL (IV SOLUTION) ×1 IMPLANT
PACK BASIN DAY SURGERY FS (CUSTOM PROCEDURE TRAY) ×1 IMPLANT
PENCIL SMOKE EVACUATOR (MISCELLANEOUS) ×1 IMPLANT
SLEEVE SCD COMPRESS KNEE MED (STOCKING) ×1 IMPLANT
SPIKE FLUID TRANSFER (MISCELLANEOUS) IMPLANT
SPONGE T-LAP 4X18 ~~LOC~~+RFID (SPONGE) ×1 IMPLANT
SUT MNCRL AB 4-0 PS2 18 (SUTURE) ×1 IMPLANT
SUT VICRYL 3-0 CR8 SH (SUTURE) ×1 IMPLANT
SYR CONTROL 10ML LL (SYRINGE) ×1 IMPLANT
TOWEL GREEN STERILE FF (TOWEL DISPOSABLE) ×1 IMPLANT
TRACER MAGTRACE VIAL (MISCELLANEOUS) IMPLANT
TRAY FAXITRON CT DISP (TRAY / TRAY PROCEDURE) ×1 IMPLANT
TUBE CONNECTING 20X1/4 (TUBING) ×1 IMPLANT
YANKAUER SUCT BULB TIP NO VENT (SUCTIONS) ×1 IMPLANT

## 2023-02-07 NOTE — Discharge Instructions (Addendum)
Post Anesthesia Home Care Instructions Next tylenol dose after 4pm. Activity: Get plenty of rest for the remainder of the day. A responsible individual must stay with you for 24 hours following the procedure.  For the next 24 hours, DO NOT: -Drive a car -Advertising copywriter -Drink alcoholic beverages -Take any medication unless instructed by your physician -Make any legal decisions or sign important papers.  Meals: Start with liquid foods such as gelatin or soup. Progress to regular foods as tolerated. Avoid greasy, spicy, heavy foods. If nausea and/or vomiting occur, drink only clear liquids until the nausea and/or vomiting subsides. Call your physician if vomiting continues.  Special Instructions/Symptoms: Your throat may feel dry or sore from the anesthesia or the breathing tube placed in your throat during surgery. If this causes discomfort, gargle with warm salt water. The discomfort should disappear within 24 hours.  If you had a scopolamine patch placed behind your ear for the management of post- operative nausea and/or vomiting:  1. The medication in the patch is effective for 72 hours, after which it should be removed.  Wrap patch in a tissue and discard in the trash. Wash hands thoroughly with soap and water. 2. You may remove the patch earlier than 72 hours if you experience unpleasant side effects which may include dry mouth, dizziness or visual disturbances. 3. Avoid touching the patch. Wash your hands with soap and water after contact with the patch.    Post Anesthesia Home Care Instructions  Activity: Get plenty of rest for the remainder of the day. A responsible individual must stay with you for 24 hours following the procedure.  For the next 24 hours, DO NOT: -Drive a car -Advertising copywriter -Drink alcoholic beverages -Take any medication unless instructed by your physician -Make any legal decisions or sign important papers.  Meals: Start with liquid foods such as  gelatin or soup. Progress to regular foods as tolerated. Avoid greasy, spicy, heavy foods. If nausea and/or vomiting occur, drink only clear liquids until the nausea and/or vomiting subsides. Call your physician if vomiting continues.  Special Instructions/Symptoms: Your throat may feel dry or sore from the anesthesia or the breathing tube placed in your throat during surgery. If this causes discomfort, gargle with warm salt water. The discomfort should disappear within 24 hours.  If you had a scopolamine patch placed behind your ear for the management of post- operative nausea and/or vomiting:  1. The medication in the patch is effective for 72 hours, after which it should be removed.  Wrap patch in a tissue and discard in the trash. Wash hands thoroughly with soap and water. 2. You may remove the patch earlier than 72 hours if you experience unpleasant side effects which may include dry mouth, dizziness or visual disturbances. 3. Avoid touching the patch. Wash your hands with soap and water after contact with the patch.   [facility] [employer] [facility] Office Phone Number [phone]  BREAST BIOPSY/ PARTIAL MASTECTOMY: POST OP INSTRUCTIONS  Always review your discharge instruction sheet given to you by the facility where your surgery was performed.  IF YOU HAVE DISABILITY OR FAMILY LEAVE FORMS, YOU MUST BRING THEM TO THE OFFICE FOR PROCESSING.  DO NOT GIVE THEM TO YOUR DOCTOR.  A prescription for pain medication may be given to you upon discharge.  Take your pain medication as prescribed, if needed.  If narcotic pain medicine is not needed, then you may take acetaminophen (Tylenol) or ibuprofen (Advil) as needed. Take your usually prescribed medications unless  otherwise directed If you need a refill on your pain medication, please contact your pharmacy.  They will contact our office to request authorization.  Prescriptions will not be filled after 5pm or on week-ends. You should eat very light  the first 24 hours after surgery, such as soup, crackers, pudding, etc.  Resume your normal diet the day after surgery. Most patients will experience some swelling and bruising in the breast.  Ice packs and a good support bra will help.  Swelling and bruising can take several days to resolve.  It is common to experience some constipation if taking pain medication after surgery.  Increasing fluid intake and taking a stool softener will usually help or prevent this problem from occurring.  A mild laxative (Milk of Magnesia or Miralax) should be taken according to package directions if there are no bowel movements after 48 hours. Unless discharge instructions indicate otherwise, you may remove your bandages 24-48 hours after surgery, and you may shower at that time.  You may have steri-strips (small skin tapes) in place directly over the incision.  These strips should be left on the skin for 7-10 days.  If your surgeon used skin glue on the incision, you may shower in 24 hours.  The glue will flake off over the next 2-3 weeks.  Any sutures or staples will be removed at the office during your follow-up visit. ACTIVITIES:  You may resume regular daily activities (gradually increasing) beginning the next day.  Wearing a good support bra or sports bra minimizes pain and swelling.  You may have sexual intercourse when it is comfortable. You may drive when you no longer are taking prescription pain medication, you can comfortably wear a seatbelt, and you can safely maneuver your car and apply brakes. RETURN TO WORK:  ______________________________________________________________________________________ Bonita Quin should see your doctor in the office for a follow-up appointment approximately two weeks after your surgery.  Your doctor's nurse will typically make your follow-up appointment when she calls you with your pathology report.  Expect your pathology report 2-3 business days after your surgery.  You may call to check if  you do not hear from [place] after three days. OTHER INSTRUCTIONS: _______________________________________________________________________________________________ _____________________________________________________________________________________________________________________________________ _____________________________________________________________________________________________________________________________________ _____________________________________________________________________________________________________________________________________  WHEN TO CALL YOUR DOCTOR: Fever over 101.0 Nausea and/or vomiting. Extreme swelling or bruising. Continued bleeding from incision. Increased pain, redness, or drainage from the incision.  The clinic staff is available to answer your questions during regular business hours.  Please don't hesitate to call and ask to speak to one of the nurses for clinical concerns.  If you have a medical emergency, go to the nearest emergency room or call 911.  A surgeon from [facility] is always on call at the hospital.  For further questions, please visit [email or web address]

## 2023-02-07 NOTE — Anesthesia Procedure Notes (Signed)
Anesthesia Regional Block: Pectoralis block   Pre-Anesthetic Checklist: , timeout performed,  Correct Patient, Correct Site, Correct Laterality,  Correct Procedure, Correct Position, site marked,  Risks and benefits discussed,  Surgical consent,  Pre-op evaluation,  At surgeon's request and post-op pain management  Laterality: Upper and Right  Prep: chloraprep       Needles:  Injection technique: Single-shot  Needle Type: Echogenic Needle     Needle Length: 9cm  Needle Gauge: 21     Additional Needles:   Procedures:,,,, ultrasound used (permanent image in chart),,    Narrative:  Start time: 02/07/2023 10:30 AM End time: 02/07/2023 10:40 AM Injection made incrementally with aspirations every 5 mL.  Performed by: Personally  Anesthesiologist: Trevor Iha, MD  Additional Notes: Block assessed. Patient tolerated procedure well.

## 2023-02-07 NOTE — Progress Notes (Signed)
Assisted Dr. Richardson Landry with right, pectoralis, ultrasound guided block. Side rails up, monitors on throughout procedure. See vital signs in flow sheet. Tolerated Procedure well.

## 2023-02-07 NOTE — Transfer of Care (Signed)
Immediate Anesthesia Transfer of Care Note  Patient: Victoria Cunningham  Procedure(s) Performed: RIGHT BREAST SEED LUMPECTOMY, RIGHT SENTINEL LYMPH NODE MAPPING (Right: Breast)  Patient Location: PACU  Anesthesia Type:GA combined with regional for post-op pain  Level of Consciousness: drowsy  Airway & Oxygen Therapy: Patient Spontanous Breathing and Patient connected to face mask oxygen  Post-op Assessment: Report given to RN and Post -op Vital signs reviewed and stable  Post vital signs: Reviewed and stable  Last Vitals:  Vitals Value Taken Time  BP 117/77 (90)   Temp    Pulse 68 02/07/23 1252  Resp 14   SpO2 100 % 02/07/23 1252  Vitals shown include unfiled device data.  Last Pain:  Vitals:   02/07/23 1002  TempSrc: Temporal  PainSc: 0-No pain         Complications: No notable events documented.

## 2023-02-07 NOTE — Op Note (Signed)
Preoperative diagnosis: Stage I right breast cancer overlapping sites  Postoperative diagnosis: Same  Procedure: Right breast seed localized lumpectomy with deep right axillary sentinel lymph node mapping using mag trace  Surgeon: Harriette Bouillon, MD  Anesthesia: LMA with right pectoral block using Exparel and 0.25% Marcaine with epinephrine  EBL: 20 cc  Specimen: Right breast tissue with seed and clip verified by Faxitron plus deep margin, 3 right axillary deep sentinel lymph nodes  Drains: None  IV fluids: Per anesthesia record  Indications for procedure: The patient is a 47 year old female with stage I right breast cancer.  She was seen in the multidisciplinary clinic setting and opted for breast conserving surgery after reviewing all of her options.The procedure has been discussed with the patient. Alternatives to surgery have been discussed with the patient.  Risks of surgery include bleeding,  Infection,  Seroma formation, death,  and the need for further surgery.   The patient understands and wishes to proceed. Sentinel lymph node mapping and dissection has been discussed with the patient.  Risk of bleeding,  Infection,  Seroma formation,  Additional procedures,, lymphedema shoulder weakness ,  Shoulder stiffness,  Nerve and blood vessel injury and reaction to the mapping dyes have been discussed.  Alternatives to surgery have been discussed with the patient.  The patient agrees to proceed.    Description of procedure: The patient was met in the holding area and questions were answered.  She underwent seed placement in the right breast as an outpatient.  A pectoral block was administered by anesthesia in the holding area on the right.  All questions were answered.  She was taken back to the operating room.  She was placed supine upon the operating room table.  After induction of general anesthesia, right breast was prepped and draped in a sterile fashion.  Timeout performed.  2 cc of mag  trace were injected.  A second timeout performed.  After prepping and draping, neoprobe was then used to identify the seed in the right central breast.  A transverse incision was made medial to the nipple areolar complex.  Dissection was carried down and all tissue around the seed and clip were excised.  The deep margin was close  therefore a deep margin was taken as a separate specimen.  The other margins were grossly clear.  The specimen was inked and then placed in the Faxitron revealing both seed and clip to be present.  This was sent to pathology.  The cavities made hemostatic with cautery.  Local anesthetic infiltrated.  Clips placed.  Deep tissue planes were approximated with 3-0 Vicryl.  4 Monocryl was used to close the skin.  Mag trace probe was used.  Incision was made in the right axillary inferior hairline region.  Dissection was carried down to the deep axillary lymph node basin on the right.  There were 3 deep right axillary level 1 nodes removed.  Background counts approached baseline.  It was irrigated.  There were no other spikes noted.  Long thoracic nerve, thoracodorsal trunk and extra vein were preserved.  Hemostasis achieved.  Arista placed.  Deep tissue planes approximated with 3-0 Vicryl.  4 Monocryl was used to close the skin in the subcuticular fashion.  Dermabond was applied.  All counts were found to be correct.  Breast binder placed.  The patient was awoke extubated taken to recovery in satisfactory condition.

## 2023-02-07 NOTE — Anesthesia Procedure Notes (Signed)
Procedure Name: LMA Insertion Date/Time: 02/07/2023 11:26 AM  Performed by: Cleda Clarks, CRNAPre-anesthesia Checklist: Patient identified, Emergency Drugs available, Suction available and Patient being monitored Patient Re-evaluated:Patient Re-evaluated prior to induction Oxygen Delivery Method: Circle system utilized Preoxygenation: Pre-oxygenation with 100% oxygen Induction Type: IV induction Ventilation: Mask ventilation without difficulty LMA: LMA inserted LMA Size: 4.0 Tube size: 4.0 mm Number of attempts: 1 Placement Confirmation: positive ETCO2 Tube secured with: Tape Dental Injury: Teeth and Oropharynx as per pre-operative assessment

## 2023-02-07 NOTE — Interval H&P Note (Signed)
History and Physical Interval Note:  02/07/2023 10:56 AM  Victoria Cunningham  has presented today for surgery, with the diagnosis of RIGHT BREAST CANCER.  The various methods of treatment have been discussed with the patient and family. After consideration of risks, benefits and other options for treatment, the patient has consented to  Procedure(s): RIGHT BREAST SEED LUMPECTOMY, RIGHT SENTINEL LYMPH NODE MAPPING (Right) as a surgical intervention.  The patient's history has been reviewed, patient examined, no change in status, stable for surgery.  I have reviewed the patient's chart and labs.  Questions were answered to the patient's satisfaction.     Dortha Schwalbe MD

## 2023-02-07 NOTE — H&P (Signed)
History of Present Illness: Victoria Cunningham is a 47 y.o. female who is seen today as an office consultation for evaluation of Breast Cancer  Patient seen today in the [facility] for newly diagnosed stage I right breast cancer. She complained of some throbbing in her right breast off and on. Denies any history of mass or discharge. Mammogram was done which showed a density in the right breast measuring 1.5 cm in the upper inner quadrant. There is also an 8 mm mass 5 cm from the nipple on the right and 5 cm in depth measuring 8 mm. 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 This was intermediate grade.  Review of Systems: A complete review of systems was obtained from the patient. I have reviewed this information and discussed as appropriate with the patient. See HPI as well for other ROS.    Medical History: Past Medical History:  Diagnosis Date  Post-operative nausea and vomiting   Patient Active Problem List  Diagnosis  Malignant neoplasm of upper-inner quadrant of right breast in female, estrogen receptor positive (CMS/HHS-HCC)   Past Surgical History:  Procedure Laterality Date  Right Breast Biopsy Right 01/05/2023    No Known Allergies  Current Outpatient Medications on File Prior to Visit  Medication Sig Dispense Refill  levonorgestreL (MIRENA 52 MG) IUD Insert 1 each into the uterus once   No current facility-administered medications on file prior to visit.   Family History  Problem Relation Age of Onset  Diabetes Father  Leukemia Father    Social History   Tobacco Use  Smoking Status Never  Smokeless Tobacco Never    Social History   Socioeconomic History  Marital status: Unknown  Tobacco Use  Smoking status: Never  Smokeless tobacco: Never  Vaping Use  Vaping status: Never Used   Social Determinants of Health   Food Insecurity: No Food Insecurity (01/03/2022)  [facility]  Hunger Vital  Sign  Worried About Running Out of Food in the Last Year: Never true  Ran Out of Food in the Last Year: Never true  Received from [employer]  Social Network   Objective:  There were no vitals filed for this visit.  There is no height or weight on file to calculate BMI.  Physical Exam Exam conducted with a chaperone present.  HENT:  Head: Normocephalic.  Cardiovascular:  Rate and Rhythm: Normal rate.  Pulmonary:  Effort: Pulmonary effort is normal.  Chest:  Breasts: Right: Normal.  Left: Normal.  Musculoskeletal:  General: Normal range of motion.  Cervical back: Normal range of motion.  Lymphadenopathy:  Upper Body:  Right upper body: No supraclavicular or axillary adenopathy.  Left upper body: No supraclavicular or axillary adenopathy.  Neurological:  General: No focal deficit present.  Mental Status: She is alert.  Psychiatric:  Mood and Affect: Mood normal.     Labs, Imaging and Diagnostic Testing: Right breast 1.5 cm upper inner quadrant core biopsy IDC ER positive PR positive HER2/neu negative KI of 5%  Assessment and Plan:   Diagnoses and all orders for this visit:  Malignant neoplasm of upper-inner quadrant of right breast in female, estrogen receptor positive (CMS/HHS-HCC)   Discussed options of breast conserving surgery with mastectomy reconstruction. The patient is opted for right breast seed localized lumpectomy with right axillary sentinel lymph node mapping. Risks and benefits reviewed of bleeding, infection, lymphedema, shoulder stiffness, numbness, pain, local regional recurrence rates, survival, quality of life and  cosmesis. We also reviewed mastectomy. Risk of bleeding, infection,'s wound complications, cosmesis, numbness, pain, lymphedema, and the need for reexcision surgery and additional surgery reviewed today. She has chosen breast conserving surgery.   Hayden Rasmussen, MD

## 2023-02-07 NOTE — Anesthesia Postprocedure Evaluation (Signed)
Anesthesia Post Note  Patient: Victoria Cunningham  Procedure(s) Performed: RIGHT BREAST SEED LUMPECTOMY, RIGHT SENTINEL LYMPH NODE MAPPING (Right: Breast)     Patient location during evaluation: PACU Anesthesia Type: Regional and General Level of consciousness: awake and alert Pain management: pain level controlled Vital Signs Assessment: post-procedure vital signs reviewed and stable Respiratory status: spontaneous breathing, nonlabored ventilation, respiratory function stable and patient connected to nasal cannula oxygen Cardiovascular status: blood pressure returned to baseline and stable Postop Assessment: no apparent nausea or vomiting Anesthetic complications: no   No notable events documented.  Last Vitals:  Vitals:   02/07/23 1315 02/07/23 1338  BP: 104/71 117/82  Pulse: (!) 53 68  Resp: 18 18  Temp:  (!) 36.2 C  SpO2: 99% 99%    Last Pain:  Vitals:   02/07/23 1338  TempSrc: Oral  PainSc: 0-No pain   Pain Goal:                   Trevor Iha

## 2023-02-08 ENCOUNTER — Encounter (HOSPITAL_BASED_OUTPATIENT_CLINIC_OR_DEPARTMENT_OTHER): Payer: Self-pay | Admitting: Surgery

## 2023-02-12 ENCOUNTER — Encounter: Payer: Self-pay | Admitting: Genetic Counselor

## 2023-02-12 ENCOUNTER — Telehealth: Payer: Self-pay | Admitting: Genetic Counselor

## 2023-02-12 ENCOUNTER — Encounter: Payer: Self-pay | Admitting: *Deleted

## 2023-02-12 DIAGNOSIS — Z1501 Genetic susceptibility to malignant neoplasm of breast: Secondary | ICD-10-CM | POA: Insufficient documentation

## 2023-02-12 NOTE — Telephone Encounter (Addendum)
I contacted Victoria Cunningham to discuss her genetic testing results. 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 We reviewed the cancer risks and management recommendations for individuals with a MSH6 gene mutation. She declined an in-person follow-up genetic counseling session. Detailed clinic note to follow.  The test report has been scanned into EPIC and is located under the Molecular Pathology section of the Results Review tab.  A portion of the result report is included below for reference.   Lalla Brothers, MS, [facility] Genetic Counselor [email or web address] (P) [phone]

## 2023-02-13 ENCOUNTER — Encounter: Payer: Self-pay | Admitting: Genetic Counselor

## 2023-02-13 ENCOUNTER — Ambulatory Visit: Payer: Self-pay | Admitting: Genetic Counselor

## 2023-02-13 DIAGNOSIS — Z1379 Encounter for other screening for genetic and chromosomal anomalies: Secondary | ICD-10-CM

## 2023-02-13 DIAGNOSIS — Z1509 Genetic susceptibility to other malignant neoplasm: Secondary | ICD-10-CM | POA: Insufficient documentation

## 2023-02-13 NOTE — Progress Notes (Signed)
HPI:   Ms. Mcbane was previously seen in the  Cancer Genetics clinic due to a personal and family history of cancer and concerns regarding a hereditary predisposition to cancer. Please refer to our prior cancer genetics clinic note for more information regarding our discussion, assessment and recommendations, at the time. Ms. Kopinski's recent genetic test results were disclosed to her, as were recommendations warranted by these results. These results and recommendations are discussed in more detail below.  CANCER HISTORY:  Oncology History  Malignant neoplasm of upper-inner quadrant of right breast in female, estrogen receptor positive (HCC)  01/03/2023 Mammogram   Suspicious 1.5 cm mass in the upper inner quadrant of the right breast. Targeted ultrasound is performed, showing an irregular hypoechoic mass with shadowing in the right breast at 2 o'clock 5 cm from the nipple measuring 8 x 7 x 8 mm. Sonographic evaluation of the right axilla does not show any enlarged adenopathy.     01/05/2023 Pathology Results   Pathology from the right breast needle core biopsy showed invasive ductal carcinoma grade 2, DCIS grade 2, prognostic showed ER 90% positive moderate to strong staining PR 100% positive strong staining, Ki-67 of 5%, HER2 0+   01/16/2023 Initial Diagnosis   Malignant neoplasm of upper-inner quadrant of right breast in female, estrogen receptor positive (HCC)    Genetic Testing   [employer] CancerNext Panel+RNA was Positive. 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 Report date is 02/07/2023.   The CancerNext gene panel offered by [employer] includes sequencing, rearrangement analysis, and RNA analysis for the following 34 genes:   APC, ATM, AXIN2, BARD1, BMPR1A, BRCA1, BRCA2, BRIP1, CDH1, CDK4, CDKN2A, CHEK2, DICER1, HOXB13, EPCAM, GREM1, MLH1, MSH2, MSH3, MSH6, MUTYH, NF1, NTHL1, PALB2, PMS2, POLD1, POLE, PTEN, RAD51C, RAD51D, SMAD4, SMARCA4, STK11, and  TP53.      FAMILY HISTORY:  We obtained a detailed, 4-generation family history.  Significant diagnoses are listed below:      Family History  Problem Relation Age of Onset   Heart disease Father     Diabetes Father     Acute lymphoblastic leukemia Father 59   Lung cancer Paternal Aunt     Breast cancer Paternal Great-grandmother 23 - 99   Anesthesia problems Neg Hx                 Ms. Persing is unaware of previous family history of genetic testing for hereditary cancer risks. There is no reported Ashkenazi Jewish ancestry.    GENETIC TEST RESULTS:  Ms. 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  The remaining 33 genes analyzed on [employer] CancerNext Panel were Negative. The CancerNext gene panel offered by [employer] includes sequencing, rearrangement analysis, and RNA analysis for the following 34 genes:   APC, ATM, AXIN2, BARD1, BMPR1A, BRCA1, BRCA2, BRIP1, CDH1, CDK4, CDKN2A, CHEK2, DICER1, HOXB13, EPCAM, GREM1, MLH1, MSH2, MSH3, MSH6, MUTYH, NF1, NTHL1, PALB2, PMS2, POLD1, POLE, PTEN, RAD51C, RAD51D, SMAD4, SMARCA4, STK11, and TP53.       Clinical Information Pathogenic variants in the MSH6 gene are associated with Lynch syndrome.  The cancers associated with MSH6 are: Colorectal cancer, 10-44% risk (average age of diagnosis is 2-69) Endometrial cancer, 16-49% risk (average age of diagnosis is 47-55) Ovarian cancer, up to a 13% risk (average age of diagnosis is 46) Renal pelvis and/or ureter, up to a 5.5% risk (average age of diagnosis is 40-69)  Bladder cancer, up to an 8.2% risk (average age of diagnosis is 13) Gastric cancer, up to a 7.9% risk (2 cases reported at ages 12 and 66) Small bowel cancer, up to a 4% risk (average age of diagnosis is 23) Brain cancer, up to a 1.8% risk (average age of diagnosis is 81-54) Breast, prostate, pancreatic, and biliary tract  cancer risk may be elevated   Management Recommendations:  Colorectal Cancer Screening: High quality colonoscopy at age 28-35 or 2-5 years prior to the earliest colon cancer if it is diagnosed before age 6 and repeat every 1-3 years Consider using daily aspirin to reduce the risk of colorectal cancer. The decision to use aspirin should be made on an individual basis, including discussion of individual risks, benefits, adverse effects, and childbearing plans.  Endometrial Cancer Screening/Risk Reduction: Women should report any abnormal uterine bleeding or postmenopausal bleeding. The evaluation of these symptoms should include an endometrial biopsy. A hysterectomy may be considered. The timing should be individualized based on whether childbearing is complete, comorbidities, and family history. A hysterectomy with bilateral salpingectomy (removal of the fallopian tubes) may be considered starting at age 50, with delayed bilateral oophorectomy (removal of the ovaries) starting at age 91. Endometrial cancer screening does not have a proven benefit in women with Lynch Syndrome. However, endometrial biopsy is highly sensitive and specific as a diagnostic procedure. Screening via endometrial biopsy every 1-2 years starting at age 42-35 can be considered. Transvaginal ultrasounds may be considered in postmenopausal women at their clinician's discretion.  Ovarian Cancer Screening/Risk Reduction: A prophylactic bilateral salpingo-oophorectomy (BSO), or having the fallopian tubes and ovaries removed, may be considered. A BSO is estimated to reduce the risk of ovarian cancer by up to 96%. Timing of a BSO should be individualized based on whether childbearing is complete, menopause status, comorbidities, and family history. A hysterectomy with bilateral salpingectomy may be considered starting at age 20, with delayed bilateral oophorectomy starting at age 58. As premature menopause due to oophorectomy can cause  detriments to bone health, cardiovascular health, and generalized quality of life, estrogen replacement therapy should be considered.  Women should be aware of symptoms that might be associated with the development of ovarian cancer including pelvic or abdominal pain, bloating, increased abdominal girth, difficulty eating, early satiety, or urinary frequency or urgency. Symptoms that persist for several weeks and are a change from a woman's baseline should prompt evaluation by her physician. Current data does not support routine ovarian screening for Lynch Syndrome. CA-125 and pelvic ultrasound are recommended for preoperative planning.  Urothelial Cancer Screening: Annual urinalysis starting at age 12-35 may be considered in selected individuals such as those with a family history of urothelial cancer (renal pelvis, ureter, and/or bladder).   Gastric and Small Bowel Cancer Screening: Esophagogastroduodenoscopy (EGD) starting at age 12-40 years and repeat every 2-4 years, preferably performed in conjunction with colonoscopy.  Age of initiation prior to 30 years and/or surveillance interval less than 2 years may be considered based on family history or high-risk endoscopic findings. Random biopsy of the proximal and distal stomach should at minimum be performed on the initial procedure to assess for H. Pylori, autoimmune gastritis, and intestinal metaplasia.  Push enteroscopy can be considered in place of EGD to enhance small bowel visualization. Individuals not undergoing upper endoscopic surveillance should have one-time noninvasive testing for H. pylori at the time of Lynch Syndrome diagnosis, with treatment indicated if H. pylori is detected.    Pancreatic Cancer Screening: Avoid smoking,  heavy alcohol use, and obesity. It has been suggested that pancreatic cancer screening be limited to those with a family history of pancreatic cancer (first- or second-degree relative). Ideally, screening should  be performed in experienced centers utilizing a multidisciplinary approach under research conditions. Recommended screening include annual endoscopic ultrasound and/or MRI of the pancreas starting at age 68 or 34 years younger than the earliest age diagnosis in the family.  Brain Cancer Screening: Patients should be educated regarding signs and symptoms of neurologic cancer and the importance of prompt reporting of abnormal symptoms to their physicians.  Prostate Cancer Screening: Consider beginning annual PSA blood test and digital rectal exams at age 75.  Skin Cancer Screening: Frequency of malignant and benign skin tumors such as sebaceous adenocarcinomas, sebaceous adenomas, and keratoacanthomas has been reported to be increased among patients with Lynch Syndrome, but cumulative lifetime risk and median age of presentation are uncertain.  Consider skin exam every 1-2 years with a health care provider skilled in identifying Lynch Syndrome-associated skin manifestations. Age to start surveillance is uncertain and can be individualized.  Additional Considerations: Patients of reproductive age should be made aware of options for prenatal diagnosis and assisted reproduction including pre-implantation genetic diagnosis. Individuals with a single pathogenic MSH6 variant are also carriers of constitutional MMR deficiency (CMMRD) syndrome. CMMRD is a childhood-onset cancer predisposition syndrome that can present with hematological malignancies, cancers of the brain and central nervous system, Lynch syndrome-associated cancers (colon, uterine, small bowel, urinary tract), embryonic tumors, and sarcomas. Some affected individuals may also display cafe-au-lait macules. For there to be a risk of CMMRD in offspring, an individual and their partner would each have to have a single pathogenic variant in the same MMR gene; in such a case, the risk of having an affected child is 25%.   This information is based  on current understanding of the gene and may change in the future.  Implications for Family Members: Hereditary predisposition to cancer due to pathogenic variants in the MSH6 gene has autosomal dominant inheritance. This means that an individual with a pathogenic variant has a 50% chance of passing the condition on to his/her offspring. Most cases are inherited from a parent, but some cases may occur spontaneously (i.e., an individual with a pathogenic variant has parents who do not have it). Identification of a pathogenic variant allows for the recognition of at-risk relatives who can pursue testing for the familial variant.  Family members are encouraged to consider genetic testing for this familial pathogenic variant. As there are generally no childhood cancer risks associated with pathogenic variants in the MSH6 gene, individuals in the family are not recommended to have testing until they reach at least 47 years of age. They may contact our office at [phone]) for more information or to schedule an appointment.  Complimentary testing for the familial variant is available for 90 days.  Family members who live outside of the area are encouraged to find a genetic counselor in their area by visiting: [email or web address].  Resources: FORCE (Facing Our Risk of Cancer Empowered) is a resource for those with a hereditary predisposition to develop cancer.  FORCE provides information about risk reduction, advocacy, legislation, and clinical trials.  Additionally, FORCE provides a platform for collaboration and support which includes: peer navigation, message boards, local support groups, a toll-free helpline, research registry and recruitment, advocate training, published medical research, webinars, brochures, mastectomy photos, and more.  For more information, visit www.facingourrisk.org  Hereditary Colon Cancer Foundation- www.hcctakesguts.org  Lynch Syndrome  International-  www.lynchcancers.com  [employer]- www.kintalk.org  Our contact number was provided. Ms. Lorensen's questions were answered to her satisfaction, and she knows she is welcome to call us at anytime with additional questions or concerns.   Lalla Brothers, MS, [facility] Genetic Counselor [email or web address] (P) [phone]

## 2023-02-14 ENCOUNTER — Telehealth: Payer: Self-pay | Admitting: *Deleted

## 2023-02-14 ENCOUNTER — Encounter: Payer: Self-pay | Admitting: *Deleted

## 2023-02-14 ENCOUNTER — Encounter: Payer: Self-pay | Admitting: Surgery

## 2023-02-14 NOTE — Telephone Encounter (Signed)
Ordered oncoytpe per Dr. Al Pimple. Sent requisition to pathology and exact sciences.

## 2023-02-23 ENCOUNTER — Telehealth: Payer: Self-pay

## 2023-02-23 ENCOUNTER — Encounter (HOSPITAL_COMMUNITY): Payer: Self-pay

## 2023-02-23 NOTE — Telephone Encounter (Signed)
[id number]: A Phase III Adjuvant Trial Evaluating the Addition of Adjuvant Chemotherapy to Ovarian Function Suppression plus Endocrine Therapy in Premenopausal Patients with pN0-1, ER-Positive/HER2-Negative Breast Cancer and an Oncotype Recurrence Score <= 25 (OFSET)  Called patient to let her know she is not eligible for the above trial based on Oncotype. She verbalized understanding. I will continue to follow for [employer] eligibility. Encouraged her to call with any questions.  [name] Cinque Begley, RN, BSN, [facility] She  Her  Hers Clinical Research Nurse [facility] Direct Dial [phone]  Pager [phone] 02/23/2023 4:31 PM

## 2023-03-06 ENCOUNTER — Inpatient Hospital Stay: Payer: 59 | Attending: Hematology and Oncology | Admitting: Hematology and Oncology

## 2023-03-06 ENCOUNTER — Encounter: Payer: Self-pay | Admitting: *Deleted

## 2023-03-06 VITALS — BP 126/73 | HR 88 | Temp 97.9°F | Resp 15 | Wt 229.0 lb

## 2023-03-06 DIAGNOSIS — Z8249 Family history of ischemic heart disease and other diseases of the circulatory system: Secondary | ICD-10-CM | POA: Diagnosis not present

## 2023-03-06 DIAGNOSIS — C50211 Malignant neoplasm of upper-inner quadrant of right female breast: Secondary | ICD-10-CM

## 2023-03-06 DIAGNOSIS — Z170 Estrogen receptor positive status [ER+]: Secondary | ICD-10-CM | POA: Diagnosis not present

## 2023-03-06 DIAGNOSIS — Z833 Family history of diabetes mellitus: Secondary | ICD-10-CM | POA: Insufficient documentation

## 2023-03-06 DIAGNOSIS — Z79899 Other long term (current) drug therapy: Secondary | ICD-10-CM | POA: Diagnosis not present

## 2023-03-06 DIAGNOSIS — Z801 Family history of malignant neoplasm of trachea, bronchus and lung: Secondary | ICD-10-CM | POA: Insufficient documentation

## 2023-03-06 DIAGNOSIS — Z1721 Progesterone receptor positive status: Secondary | ICD-10-CM | POA: Insufficient documentation

## 2023-03-06 DIAGNOSIS — Z806 Family history of leukemia: Secondary | ICD-10-CM | POA: Diagnosis not present

## 2023-03-06 DIAGNOSIS — Z803 Family history of malignant neoplasm of breast: Secondary | ICD-10-CM | POA: Insufficient documentation

## 2023-03-06 DIAGNOSIS — Z793 Long term (current) use of hormonal contraceptives: Secondary | ICD-10-CM | POA: Insufficient documentation

## 2023-03-06 NOTE — Assessment & Plan Note (Signed)
This is a very pleasant 47 year old female patient with newly diagnosed right breast invasive ductal carcinoma, grade 2, ER/PR positive, HER2 negative referred to breast [facility] for additional recommendations.  Given small tumor measuring 8 x 7 x 8 mm, node-negative T, strong ER/PR positivity she will proceed with upfront surgery followed by consideration for Oncotype DX testing.   Right breast lumpectomy showed multifocal breast cancer, known area of invasive ductal carcinoma as well as a new area of invasive lobular carcinoma noted.  Final margins negative, 3 sentinel lymph nodes negative. Prior prognostics from the invasive ductal carcinoma showed ER/PR positive HER2 negative, low Ki-67.  Oncotype DX sent on the invasive carcinoma resulted at 11, no added benefit from adjuvant chemotherapy. We will try to add prognostics on the invasive lobular cancer as well, discussed with our nurse navigator.  Given low-grade invasive lobular histology, I do not believe this is usually HER2 amplified.  In the meantime we will refer her back to radiation oncology for consideration of adjuvant radiation.  She understands the role of adjuvant radiation and antiestrogen therapy.  In the case that the invasive lobular histology turns to be HER2 amplified, she understands that there may be some role for chemotherapy and HER2 based therapy.

## 2023-03-06 NOTE — Progress Notes (Signed)
[facility] CONSULT NOTE  Patient Care Team: [facility] as PCP - General Victoria Angelica, RN as Oncology Nurse Navigator Victoria Cunningham, Victoria Ditty, RN as Oncology Nurse Navigator Victoria Moulds, MD as Consulting Physician (Hematology and Oncology) Victoria Puffer, MD as Consulting Physician (Radiation Oncology) Victoria Bouillon, MD as Consulting Physician (General Surgery)  CHIEF COMPLAINTS/PURPOSE OF CONSULTATION:  Newly diagnosed breast cancer  HISTORY OF PRESENTING ILLNESS:  Victoria Cunningham 47 y.o. female is here because of recent diagnosis of right breast IDC  I reviewed her records extensively and collaborated the history with the patient.  SUMMARY OF ONCOLOGIC HISTORY: Oncology History  Malignant neoplasm of upper-inner quadrant of right breast in female, estrogen receptor positive (HCC)  01/03/2023 Mammogram   Suspicious 1.5 cm mass in the upper inner quadrant of the right breast. Targeted ultrasound is performed, showing an irregular hypoechoic mass with shadowing in the right breast at 2 o'clock 5 cm from the nipple measuring 8 x 7 x 8 mm. Sonographic evaluation of the right axilla does not show any enlarged adenopathy.     01/05/2023 Pathology Results   Pathology from the right breast needle core biopsy showed invasive ductal carcinoma grade 2, DCIS grade 2, prognostic showed ER 90% positive moderate to strong staining PR 100% positive strong staining, Ki-67 of 5%, HER2 0+   01/16/2023 Initial Diagnosis   Malignant neoplasm of upper-inner quadrant of right breast in female, estrogen receptor positive (HCC)    Genetic Testing   [facility] CancerNext Panel+RNA was Positive. 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 Report date is 02/07/2023.   The CancerNext gene panel offered by [employer] includes sequencing, rearrangement analysis, and RNA analysis for the following 34 genes:   APC, ATM, AXIN2, BARD1, BMPR1A,  BRCA1, BRCA2, BRIP1, CDH1, CDK4, CDKN2A, CHEK2, DICER1, HOXB13, EPCAM, GREM1, MLH1, MSH2, MSH3, MSH6, MUTYH, NF1, NTHL1, PALB2, PMS2, POLD1, POLE, PTEN, RAD51C, RAD51D, SMAD4, SMARCA4, STK11, and TP53.     This is a very pleasant 47 year old premenopausal female patient with no significant past medical history on Mirena IUD referred to breast [facility] given screen mammogram detected breast cancer.  She is very healthy at baseline.  She has 4 children, breast-fed all of them for approximately 2 years each.  No family history of breast cancer.  Family history significant for AL L and dad who had bone marrow transplant.  She denies any major medical issues.     Interval history [name] is here for follow-up after her breast surgery.  She has been healing well.  She denies any complaints.  She is thrilled to hear the Oncotype DX results.  MEDICAL HISTORY:  Past Medical History:  Diagnosis Date   Cancer (HCC)    breast   PONV (postoperative nausea and vomiting)     SURGICAL HISTORY: Past Surgical History:  Procedure Laterality Date   BREAST BIOPSY Right 01/05/2023   [place] RT BREAST BX W LOC DEV 1ST LESION IMG BX SPEC US GUIDE 01/05/2023 [facility]   BREAST BIOPSY  02/06/2023   MM RT RADIOACTIVE SEED LOC MAMMO GUIDE 02/06/2023 [facility]MO[facility]   BREAST LUMPECTOMY WITH RADIOACTIVE SEED AND SENTINEL LYMPH NODE BIOPSY Right 02/07/2023   Procedure: RIGHT BREAST SEED LUMPECTOMY, RIGHT SENTINEL LYMPH NODE MAPPING;  Surgeon: Victoria Bouillon, MD;  Location: [facility];  Service: General;  Laterality: Right;   DIAGNOSTIC LAPAROSCOPY      SOCIAL HISTORY: Social History   Socioeconomic  History   Marital status: Married    Spouse name: Not on file   Number of children: Not on file   Years of education: Not on file   Highest education level: Not on file  Occupational History   Not on file  Tobacco Use   Smoking status: Never   Smokeless tobacco: Never  Vaping Use   Vaping  status: Never Used  Substance and Sexual Activity   Alcohol use: No   Drug use: No   Sexual activity: Yes    Birth control/protection: None  Other Topics Concern   Not on file  Social History Narrative   Not on file   Social Determinants of Health   Financial Resource Strain: Not on file  Food Insecurity: No Food Insecurity (01/03/2022)   Received from [facility]   Hunger Vital Sign    Worried About Running Out of Food in the Last Year: Never true    Ran Out of Food in the Last Year: Never true  Transportation Needs: Not on file  Physical Activity: Not on file  Stress: Not on file  Social Connections: Unknown (12/29/2021)   Received from [facility]   Social Network    Social Network: Not on file  Intimate Partner Violence: Unknown (12/29/2021)   Received from [facility]   HITS    Physically Hurt: Not on file    Insult or Talk Down To: Not on file    Threaten Physical Harm: Not on file    Scream or Curse: Not on file    FAMILY HISTORY: Family History  Problem Relation Age of Onset   Heart disease Father    Diabetes Father    Acute lymphoblastic leukemia Father 1   Lung cancer Paternal Aunt    Breast cancer Paternal Great-grandmother 60 - 99   Anesthesia problems Neg Hx     ALLERGIES:  has No Known Allergies.  MEDICATIONS:  Current Outpatient Medications  Medication Sig Dispense Refill   ibuprofen (ADVIL) 800 MG tablet Take 1 tablet (800 mg total) by mouth every 8 (eight) hours as needed. 30 tablet 0   levonorgestrel (MIRENA) 20 MCG/DAY IUD 1 each by Intrauterine route once.     oxyCODONE (OXY IR/ROXICODONE) 5 MG immediate release tablet Take 1 tablet (5 mg total) by mouth every 6 (six) hours as needed for severe pain. 15 tablet 0   No current facility-administered medications for this visit.    REVIEW OF SYSTEMS:   Constitutional: Denies fevers, chills or abnormal night sweats Eyes: Denies blurriness of vision, double vision or watery eyes Ears,  nose, mouth, throat, and face: Denies mucositis or sore throat Respiratory: Denies cough, dyspnea or wheezes Cardiovascular: Denies palpitation, chest discomfort or lower extremity swelling Gastrointestinal:  Denies nausea, heartburn or change in bowel habits Skin: Denies abnormal skin rashes Lymphatics: Denies new lymphadenopathy or easy bruising Neurological:Denies numbness, tingling or new weaknesses Behavioral/Psych: Mood is stable, no new changes  Breast: Denies any palpable lumps or discharge All other systems were reviewed with the patient and are negative.  PHYSICAL EXAMINATION: ECOG PERFORMANCE STATUS: 0 - Asymptomatic  Vitals:   03/06/23 1346  BP: 126/73  Pulse: 88  Resp: 15  Temp: 97.9 F (36.6 C)  SpO2: 100%   Filed Weights   03/06/23 1346  Weight: 229 lb (103.9 kg)    GENERAL:alert, no distress and comfortable  Lab Results  Component Value Date   WBC 5.8 01/17/2023   HGB 14.1 01/17/2023   HCT 42.1  01/17/2023   MCV 86.6 01/17/2023   PLT 239 01/17/2023   Lab Results  Component Value Date   NA 139 01/17/2023   K 3.8 01/17/2023   CL 105 01/17/2023   CO2 28 01/17/2023    RADIOGRAPHIC STUDIES: I have personally reviewed the radiological reports and agreed with the findings in the report.  ASSESSMENT AND PLAN:  Malignant neoplasm of upper-inner quadrant of right breast in female, estrogen receptor positive (HCC) This is a very pleasant 47 year old female patient with newly diagnosed right breast invasive ductal carcinoma, grade 2, ER/PR positive, HER2 negative referred to breast [facility] for additional recommendations.  Given small tumor measuring 8 x 7 x 8 mm, node-negative T, strong ER/PR positivity she will proceed with upfront surgery followed by consideration for Oncotype DX testing.   Right breast lumpectomy showed multifocal breast cancer, known area of invasive ductal carcinoma as well as a new area of invasive lobular carcinoma noted.  Final margins  negative, 3 sentinel lymph nodes negative. Prior prognostics from the invasive ductal carcinoma showed ER/PR positive HER2 negative, low Ki-67.  Oncotype DX sent on the invasive carcinoma resulted at 11, no added benefit from adjuvant chemotherapy. We will try to add prognostics on the invasive lobular cancer as well, discussed with our nurse navigator.  Given low-grade invasive lobular histology, I do not believe this is usually HER2 amplified.  In the meantime we will refer her back to radiation oncology for consideration of adjuvant radiation.  She understands the role of adjuvant radiation and antiestrogen therapy.  In the case that the invasive lobular histology turns to be HER2 amplified, she understands that there may be some role for chemotherapy and HER2 based therapy.     All questions were answered. The patient knows to call the clinic with any problems, questions or concerns.    Victoria Moulds, MD 03/06/23

## 2023-03-09 ENCOUNTER — Ambulatory Visit
Admission: RE | Admit: 2023-03-09 | Discharge: 2023-03-09 | Disposition: A | Discharge status: Home / Self Care | Payer: 59 | Source: Ambulatory Visit | Attending: Radiology | Admitting: Radiology

## 2023-03-09 ENCOUNTER — Encounter: Payer: Self-pay | Admitting: Radiology

## 2023-03-09 ENCOUNTER — Ambulatory Visit
Admission: RE | Admit: 2023-03-09 | Discharge: 2023-03-09 | Disposition: A | Discharge status: Home / Self Care | Payer: 59 | Source: Ambulatory Visit | Attending: Radiation Oncology

## 2023-03-09 VITALS — BP 125/86 | HR 80 | Temp 98.0°F | Resp 18 | Wt 226.1 lb

## 2023-03-09 DIAGNOSIS — C50211 Malignant neoplasm of upper-inner quadrant of right female breast: Secondary | ICD-10-CM | POA: Insufficient documentation

## 2023-03-09 DIAGNOSIS — Z803 Family history of malignant neoplasm of breast: Secondary | ICD-10-CM | POA: Diagnosis not present

## 2023-03-09 DIAGNOSIS — Z170 Estrogen receptor positive status [ER+]: Secondary | ICD-10-CM

## 2023-03-09 DIAGNOSIS — Z801 Family history of malignant neoplasm of trachea, bronchus and lung: Secondary | ICD-10-CM | POA: Diagnosis not present

## 2023-03-09 LAB — PREGNANCY, URINE: Preg Test, Ur: NEGATIVE

## 2023-03-09 NOTE — Progress Notes (Signed)
New Breast Cancer Diagnosis: Right Breast  Patient was seen in the multidisciplinary breast clinic on 01/17/2023.  She has completed her surgery and is now here to discuss radiation therapy.   Histology per Pathology Report: Right Breast Lumpectomy 02/07/2023   Receptor Status: ER(positive), PR (positive), Her2-neu (negative), Ki-(5%)   Surgeon and surgical plan, if any:  Dr. Luisa Hart  Medical oncologist, treatment if any:   Dr. Al Pimple   Family History of Breast/Ovarian/Prostate Cancer: None   Lymphedema issues, if any:  no right   Pain issues, if any:  no   SAFETY ISSUES: Prior radiation? no Pacemaker/ICD? no Possible current pregnancy?no Is the patient on methotrexate? no  Current Complaints / other details:   Vitals:   03/09/23 1340  BP: 125/86  Pulse: 80  Resp: 18  Temp: 98 F (36.7 C)  TempSrc: Oral  SpO2: 100%  Weight: 102.6 kg

## 2023-03-09 NOTE — Progress Notes (Signed)
Radiation Oncology         (336) [phone] ________________________________  Name: Victoria Cunningham        MRN: [id number]  Date of Service: 03/09/2023 DOB: 1975-06-23  DG:UYQIH, [facility], MD     REFERRING PHYSICIAN: Rachel Moulds, MD   DIAGNOSIS: The encounter diagnosis was Malignant neoplasm of upper-inner quadrant of right breast in female, estrogen receptor positive (HCC).   Cancer Staging  Malignant neoplasm of upper-inner quadrant of right breast in female, estrogen receptor positive (HCC) Staging form: Breast, AJCC 8th Edition - Clinical stage from 01/17/2023: Stage IA (cT1b, cN0, cM0, G2, ER+, PR+, HER2-) - Unsigned Stage prefix: Initial diagnosis Histologic grading system: 3 grade system Laterality: Right Staged by: Pathologist and managing physician Stage used in treatment planning: Yes National guidelines used in treatment planning: Yes Type of national guideline used in treatment planning: NCCN    HISTORY OF PRESENT ILLNESS: Since her breast clinic consultation date of 01/17/23, the patient proceeded with a lumpectomy under the care of Dr. Luisa Hart on 02/07/2023. Surgical pathology revealed multifocal invasive carcinoma including: intermediate grade invasive ductal carcinoma measuring 1.3 cm in greatest dimension, low grade invasive lobular carcinoma measuring 0.9 cm in greatest dimension, and intermediate grade ductal carcinoma in situ. All sampled nodes and final margins were negative for carcinoma. Prognostic indicators for the ductal carcinoma were significant for: estrogen receptor 90%, positive with moderate-strong staining intensity, progesterone receptor 100%, positive with strong staining intensity, Ki-67 5%, HER2 negative.   Her Oncotype DX results indicated no significant benefit from chemotherapy. The patient met with Dr. Al Pimple on 03/06/2023 who recommended adjuvant antiestrogen therapy. The prognostics on the invasive lobular  cancer were not performed, so she recommended completing this to see if there is a role for chemotherapy and HER2 based therapy.   Symptomatically, the patient reports to be doing well overall. She endorses some breast tenderness but she feels as though her range of motion is returning to normal. She denies any issues with lymphedema and is working on getting scheduled to see physical therapy.   PREVIOUS RADIATION THERAPY: No   PAST MEDICAL HISTORY:  Past Medical History:  Diagnosis Date   Cancer (HCC)    breast   PONV (postoperative nausea and vomiting)        PAST SURGICAL HISTORY: Past Surgical History:  Procedure Laterality Date   BREAST BIOPSY Right 01/05/2023   [place] RT BREAST BX W LOC DEV 1ST LESION IMG BX SPEC US GUIDE 01/05/2023 [facility]   BREAST BIOPSY  02/06/2023   MM RT RADIOACTIVE SEED LOC MAMMO GUIDE 02/06/2023 [facility]MO[facility]   BREAST LUMPECTOMY WITH RADIOACTIVE SEED AND SENTINEL LYMPH NODE BIOPSY Right 02/07/2023   Procedure: RIGHT BREAST SEED LUMPECTOMY, RIGHT SENTINEL LYMPH NODE MAPPING;  Surgeon: Harriette Bouillon, MD;  Location: [facility];  Service: General;  Laterality: Right;   DIAGNOSTIC LAPAROSCOPY       FAMILY HISTORY:  Family History  Problem Relation Age of Onset   Heart disease Father    Diabetes Father    Acute lymphoblastic leukemia Father 32   Lung cancer Paternal Aunt    Breast cancer Paternal Great-grandmother 67 - 99   Anesthesia problems Neg Hx      SOCIAL HISTORY:  reports that she has never smoked. She has never used smokeless tobacco. She reports that she does not drink alcohol and does not use drugs. She is a mom to four children and works as a  music Runner, broadcasting/film/video at [employer].    ALLERGIES: Patient has no known allergies.   MEDICATIONS:  Current Outpatient Medications  Medication Sig Dispense Refill   levonorgestrel (MIRENA) 20 MCG/DAY IUD 1 each by Intrauterine route once.     ibuprofen (ADVIL) 800  MG tablet Take 1 tablet (800 mg total) by mouth every 8 (eight) hours as needed. (Patient not taking: Reported on 03/09/2023) 30 tablet 0   oxyCODONE (OXY IR/ROXICODONE) 5 MG immediate release tablet Take 1 tablet (5 mg total) by mouth every 6 (six) hours as needed for severe pain. 15 tablet 0   No current facility-administered medications for this encounter.    PHYSICAL EXAM:  Wt Readings from Last 3 Encounters:  03/09/23 226 lb 2 oz (102.6 kg)  03/06/23 229 lb (103.9 kg)  02/07/23 223 lb 12.3 oz (101.5 kg)   Temp Readings from Last 3 Encounters:  03/09/23 98 F (36.7 C) (Oral)  03/06/23 97.9 F (36.6 C) (Temporal)  02/07/23 (!) 97.2 F (36.2 C) (Oral)   BP Readings from Last 3 Encounters:  03/09/23 125/86  03/06/23 126/73  02/07/23 117/82   Pulse Readings from Last 3 Encounters:  03/09/23 80  03/06/23 88  02/07/23 68    In general this is a well appearing  female in no acute distress. She's alert and oriented x4 and appropriate throughout the examination. Cardiopulmonary assessment is negative for acute distress and she exhibits normal effort. Right breast: well healed lumpectomy and axillary incisions. Skin edges are well approximated with no signs of infection or delayed wound healing. Mag trace visible circumferential to the lumpectomy incision.     ECOG = 0   0 - Asymptomatic (Fully active, able to carry on all predisease activities without restriction)  1 - Symptomatic but completely ambulatory (Restricted in physically strenuous activity but ambulatory and able to carry out work of a light or sedentary nature. For example, light housework, office work)  2 - Symptomatic, <50% in bed during the day (Ambulatory and capable of all self care but unable to carry out any work activities. Up and about more than 50% of waking hours)  3 - Symptomatic, >50% in bed, but not bedbound (Capable of only limited self-care, confined to bed or chair 50% or more of waking hours)  4 -  Bedbound (Completely disabled. Cannot carry on any self-care. Totally confined to bed or chair)  5 - Death   Santiago Glad MM, Creech RH, Tormey DC, et al. [phone]). "Toxicity and response criteria of the [employer]". Am. Evlyn Clines. Oncol. 5 (6): 649-55    LABORATORY DATA:  Lab Results  Component Value Date   WBC 5.8 01/17/2023   HGB 14.1 01/17/2023   HCT 42.1 01/17/2023   MCV 86.6 01/17/2023   PLT 239 01/17/2023   Lab Results  Component Value Date   NA 139 01/17/2023   K 3.8 01/17/2023   CL 105 01/17/2023   CO2 28 01/17/2023   Lab Results  Component Value Date   ALT 24 01/17/2023   AST 24 01/17/2023   ALKPHOS 48 01/17/2023   BILITOT 0.5 01/17/2023      RADIOGRAPHY: No results found.     IMPRESSION/PLAN: 1. Grade 2 invasive ductal carcinoma, ER/PR +, HER2-; s/p lumpectomy  Patient underwent successful lumpectomy under the care of Dr. Luisa Hart. Dr. Mitzi Hansen recommends radiation to decrease her risk of locoregional disease recurrence. Today we again discussed the natural course of early stage breast disease. We reviewed the role  of radiotherapy in the management and focused on the details of logistics and delivery. We reviewed the anticipated acute and late sequelae associated with radiation in this setting. We again discussed the traditional 6.5-week course of treatment and the newer hypofractionated 4-week course. While the hypofractionated treatment has less long-term data, we still offer the 6.5-week course for younger patients. The patient would like to proceed with the hypofractionated 4-week course of treatment. She patient was encouraged to ask questions that I answered to the best of my ability. Patient expressed readiness to proceed with treatment. A consent form was reviewed and signed today.    Plan:  Patient is scheduled for CT simulation on 03/13/23. Will plan to begin radiation approximately one week after simulation. Plan to deliver 40.05 Gy in 15  fractions to the right breast followed by a boost of 10 Gy in 5 fractions to the lumpectomy cavity.  In a visit lasting 60 minutes, greater than 50% of the time was spent face to face reviewing her case, as well as in preparation of, discussing, and coordinating the patient's care.  The above documentation reflects my direct findings during this shared patient visit. Please see the separate note by Dr. Mitzi Hansen on this date for the remainder of the patient's plan of care.    Joyice Faster, [place]    **Disclaimer: This note was dictated with voice recognition software. Similar sounding words can inadvertently be transcribed and this note may contain transcription errors which may not have been corrected upon publication of note.**

## 2023-03-10 ENCOUNTER — Encounter: Payer: Self-pay | Admitting: Surgery

## 2023-03-12 ENCOUNTER — Encounter: Payer: Self-pay | Admitting: *Deleted

## 2023-03-12 LAB — SURGICAL PATHOLOGY

## 2023-03-13 ENCOUNTER — Ambulatory Visit
Admission: RE | Admit: 2023-03-13 | Discharge: 2023-03-13 | Disposition: A | Discharge status: Home / Self Care | Payer: 59 | Source: Ambulatory Visit | Attending: Radiation Oncology | Admitting: Radiation Oncology

## 2023-03-13 DIAGNOSIS — C50211 Malignant neoplasm of upper-inner quadrant of right female breast: Secondary | ICD-10-CM | POA: Diagnosis not present

## 2023-03-19 ENCOUNTER — Encounter: Payer: Self-pay | Admitting: *Deleted

## 2023-03-19 DIAGNOSIS — Z170 Estrogen receptor positive status [ER+]: Secondary | ICD-10-CM

## 2023-03-22 DIAGNOSIS — Z170 Estrogen receptor positive status [ER+]: Secondary | ICD-10-CM | POA: Diagnosis present

## 2023-03-22 DIAGNOSIS — Z510 Encounter for antineoplastic radiation therapy: Secondary | ICD-10-CM | POA: Diagnosis present

## 2023-03-22 DIAGNOSIS — C50211 Malignant neoplasm of upper-inner quadrant of right female breast: Secondary | ICD-10-CM | POA: Diagnosis present

## 2023-03-27 ENCOUNTER — Encounter: Payer: 59 | Admitting: Internal Medicine

## 2023-03-27 ENCOUNTER — Ambulatory Visit
Admission: RE | Admit: 2023-03-27 | Discharge: 2023-03-27 | Disposition: A | Discharge status: Home / Self Care | Payer: 59 | Source: Ambulatory Visit | Attending: Radiation Oncology | Admitting: Radiation Oncology

## 2023-03-27 ENCOUNTER — Other Ambulatory Visit: Payer: Self-pay

## 2023-03-27 DIAGNOSIS — C50211 Malignant neoplasm of upper-inner quadrant of right female breast: Secondary | ICD-10-CM | POA: Diagnosis not present

## 2023-03-27 LAB — RAD ONC ARIA SESSION SUMMARY
Course Elapsed Days: 0
Plan Fractions Treated to Date: 1
Plan Prescribed Dose Per Fraction: 2.66 Gy
Plan Total Fractions Prescribed: 16
Plan Total Prescribed Dose: 42.56 Gy
Reference Point Dosage Given to Date: 2.66 Gy
Reference Point Session Dosage Given: 2.66 Gy
Session Number: 1

## 2023-03-28 ENCOUNTER — Other Ambulatory Visit: Payer: Self-pay

## 2023-03-28 ENCOUNTER — Ambulatory Visit
Admission: RE | Admit: 2023-03-28 | Discharge: 2023-03-28 | Disposition: A | Discharge status: Home / Self Care | Payer: 59 | Source: Ambulatory Visit | Attending: Radiation Oncology | Admitting: Radiation Oncology

## 2023-03-28 DIAGNOSIS — C50211 Malignant neoplasm of upper-inner quadrant of right female breast: Secondary | ICD-10-CM | POA: Diagnosis not present

## 2023-03-28 LAB — RAD ONC ARIA SESSION SUMMARY
Course Elapsed Days: 1
Plan Fractions Treated to Date: 2
Plan Prescribed Dose Per Fraction: 2.66 Gy
Plan Total Fractions Prescribed: 16
Plan Total Prescribed Dose: 42.56 Gy
Reference Point Dosage Given to Date: 5.32 Gy
Reference Point Session Dosage Given: 2.66 Gy
Session Number: 2

## 2023-03-29 ENCOUNTER — Other Ambulatory Visit: Payer: Self-pay

## 2023-03-29 ENCOUNTER — Ambulatory Visit
Admission: RE | Admit: 2023-03-29 | Discharge: 2023-03-29 | Disposition: A | Discharge status: Home / Self Care | Payer: 59 | Source: Ambulatory Visit | Attending: Radiation Oncology | Admitting: Radiation Oncology

## 2023-03-29 DIAGNOSIS — C50211 Malignant neoplasm of upper-inner quadrant of right female breast: Secondary | ICD-10-CM | POA: Diagnosis not present

## 2023-03-29 LAB — RAD ONC ARIA SESSION SUMMARY
Course Elapsed Days: 2
Plan Fractions Treated to Date: 3
Plan Prescribed Dose Per Fraction: 2.66 Gy
Plan Total Fractions Prescribed: 16
Plan Total Prescribed Dose: 42.56 Gy
Reference Point Dosage Given to Date: 7.98 Gy
Reference Point Session Dosage Given: 2.66 Gy
Session Number: 3

## 2023-03-30 ENCOUNTER — Other Ambulatory Visit: Payer: Self-pay

## 2023-03-30 ENCOUNTER — Ambulatory Visit
Admission: RE | Admit: 2023-03-30 | Discharge: 2023-03-30 | Disposition: A | Discharge status: Home / Self Care | Payer: 59 | Source: Ambulatory Visit | Attending: Radiation Oncology | Admitting: Radiation Oncology

## 2023-03-30 DIAGNOSIS — Z170 Estrogen receptor positive status [ER+]: Secondary | ICD-10-CM

## 2023-03-30 DIAGNOSIS — C50211 Malignant neoplasm of upper-inner quadrant of right female breast: Secondary | ICD-10-CM | POA: Diagnosis not present

## 2023-03-30 LAB — RAD ONC ARIA SESSION SUMMARY
Course Elapsed Days: 3
Plan Fractions Treated to Date: 4
Plan Prescribed Dose Per Fraction: 2.66 Gy
Plan Total Fractions Prescribed: 16
Plan Total Prescribed Dose: 42.56 Gy
Reference Point Dosage Given to Date: 10.64 Gy
Reference Point Session Dosage Given: 2.66 Gy
Session Number: 4

## 2023-03-30 MED ORDER — RADIAPLEXRX EX GEL: Freq: Once | CUTANEOUS | Status: AC

## 2023-03-30 MED ORDER — ALRA NON-METALLIC DEODORANT (RAD-ONC)
1.0000 | Freq: Once | TOPICAL | Status: AC
  Administered 2023-03-30: 1 via TOPICAL

## 2023-04-02 ENCOUNTER — Other Ambulatory Visit: Payer: Self-pay

## 2023-04-02 ENCOUNTER — Ambulatory Visit
Admission: RE | Admit: 2023-04-02 | Discharge: 2023-04-02 | Disposition: A | Discharge status: Home / Self Care | Payer: 59 | Source: Ambulatory Visit | Attending: Radiation Oncology

## 2023-04-02 ENCOUNTER — Ambulatory Visit: Payer: 59 | Attending: Surgery | Admitting: Physical Therapy

## 2023-04-02 ENCOUNTER — Encounter: Payer: Self-pay | Admitting: Physical Therapy

## 2023-04-02 DIAGNOSIS — C50211 Malignant neoplasm of upper-inner quadrant of right female breast: Secondary | ICD-10-CM

## 2023-04-02 DIAGNOSIS — Z170 Estrogen receptor positive status [ER+]: Secondary | ICD-10-CM | POA: Insufficient documentation

## 2023-04-02 DIAGNOSIS — Z483 Aftercare following surgery for neoplasm: Secondary | ICD-10-CM | POA: Diagnosis present

## 2023-04-02 DIAGNOSIS — R293 Abnormal posture: Secondary | ICD-10-CM

## 2023-04-02 LAB — RAD ONC ARIA SESSION SUMMARY
Course Elapsed Days: 6
Plan Fractions Treated to Date: 5
Plan Prescribed Dose Per Fraction: 2.66 Gy
Plan Total Fractions Prescribed: 16
Plan Total Prescribed Dose: 42.56 Gy
Reference Point Dosage Given to Date: 13.3 Gy
Reference Point Session Dosage Given: 2.66 Gy
Session Number: 5

## 2023-04-02 NOTE — Therapy (Signed)
OUTPATIENT PHYSICAL THERAPY BREAST CANCER POST OP FOLLOW UP   Patient Name: Victoria Cunningham MRN: [id number] DOB:[birth year], 47 y.o., female Today's Date: 04/02/2023  END OF SESSION:  PT End of Session - 04/02/23 1101     Visit Number 2    Number of Visits 3    Date for PT Re-Evaluation 05/07/23    PT Start Time 1053    PT Stop Time 1145    PT Time Calculation (min) 52 min    Activity Tolerance Patient tolerated treatment well    Behavior During Therapy WFL for tasks assessed/performed             Past Medical History:  Diagnosis Date   Cancer (HCC)    breast   PONV (postoperative nausea and vomiting)    Past Surgical History:  Procedure Laterality Date   BREAST BIOPSY Right 01/05/2023   [place] RT BREAST BX W LOC DEV 1ST LESION IMG BX SPEC US GUIDE 01/05/2023 [facility]   BREAST BIOPSY  02/06/2023   MM RT RADIOACTIVE SEED LOC MAMMO GUIDE 02/06/2023 [facility]   BREAST LUMPECTOMY WITH RADIOACTIVE SEED AND SENTINEL LYMPH NODE BIOPSY Right 02/07/2023   Procedure: RIGHT BREAST SEED LUMPECTOMY, RIGHT SENTINEL LYMPH NODE MAPPING;  Surgeon: Harriette Bouillon, MD;  Location: [facility];  Service: General;  Laterality: Right;   DIAGNOSTIC LAPAROSCOPY     Patient Active Problem List   Diagnosis Date Noted   MSH6 positive 02/12/2023   Genetic testing 02/01/2023   Malignant neoplasm of upper-inner quadrant of right breast in female, estrogen receptor positive (HCC) 01/16/2023   Baker's cyst of knee, left 03/16/2021   Indication for care in labor and delivery, antepartum 08/15/2014   Vaginal bleeding in pregnancy    [redacted] weeks gestation of pregnancy     REFERRING PROVIDER: Dr. Harriette Bouillon  REFERRING DIAG: Right breast cancer  THERAPY DIAG:  Malignant neoplasm of upper-inner quadrant of right breast in female, estrogen receptor positive (HCC)  Abnormal posture  Aftercare following surgery for neoplasm  Rationale for Evaluation and Treatment:  Rehabilitation  ONSET DATE: 02/07/2023  SUBJECTIVE:                                                                                                                                                                                           SUBJECTIVE STATEMENT: Patient reports she underwent a right lumpectomy and sentinel node biopsy on 02/07/2023 (3 negative nodes) on 02/07/2023. Her Oncotype score was 11 so no chemotherapy is needed but she is currently undergoing week 2 of 4 weeks of radiation and will also undergo anti-estrogen therapy. She will undergo  a hysterectomy 06/06/2023.   PERTINENT HISTORY:  Patient was diagnosed on 12/19/2022 with right grade 2 invasive ductal carcinoma breast cancer. She underwent a right lumpectomy and sentinel node biopsy on 02/07/2023 (3 negative nodes) on 02/07/2023. It is ER/PR positive and HER2 negative with a Ki67 of 5%.   PATIENT GOALS:  Reassess how my recovery is going related to arm function, pain, and swelling.  PAIN:  Are you having pain? Yes: NPRS scale: 4/10 Pain location: right hand to elbow Pain description: sore, sharp Aggravating factors: Worst in morning; forearm supination Relieving factors: Forearm pronation  PRECAUTIONS: Recent Surgery, right UE Lymphedema risk  RED FLAGS: None   ACTIVITY LEVEL / LEISURE: She does not exercise   OBJECTIVE:   PATIENT SURVEYS:  QUICK DASH:  Quick Dash - 04/02/23 0001     Open a tight or new jar Mild difficulty    Do heavy household chores (wash walls, wash floors) No difficulty    Carry a shopping bag or briefcase No difficulty    Wash your back No difficulty    Use a knife to cut food No difficulty    Recreational activities in which you take some force or impact through your arm, shoulder, or hand (golf, hammering, tennis) Mild difficulty    During the past week, to what extent has your arm, shoulder or hand problem interfered with your normal social activities with family, friends, neighbors, or  groups? Slightly    During the past week, to what extent has your arm, shoulder or hand problem limited your work or other regular daily activities Slightly    Arm, shoulder, or hand pain. Mild    Tingling (pins and needles) in your arm, shoulder, or hand Mild    Difficulty Sleeping Mild difficulty    DASH Score 15.91 %             OBSERVATIONS: Right breast and axillary incisions are both well healed. There is palpable thick scar tissue present at both sites with breast scar tissue being more prominent that her axilla.  Pt with positive neural tension testing in right UE. Axillary cording is not visible due to body habitus but symptoms appear to be related to cording.  POSTURE:  Forward head and rounded shoulders  LYMPHEDEMA ASSESSMENT:    UPPER EXTREMITY AROM/PROM:   A/PROM RIGHT   eval   RIGHT 04/02/2023  Shoulder extension 57 60  Shoulder flexion 155 153  Shoulder abduction 173 166  Shoulder internal rotation 81 78  Shoulder external rotation 87 86                          (Blank rows = not tested)   A/PROM LEFT   eval  Shoulder extension 53  Shoulder flexion 159  Shoulder abduction 172  Shoulder internal rotation 74  Shoulder external rotation 80                          (Blank rows = not tested)   CERVICAL AROM: All within normal limits   UPPER EXTREMITY STRENGTH: WNL   LYMPHEDEMA ASSESSMENTS:    LANDMARK RIGHT   eval RIGHT 04/02/2023  10 cm proximal to olecranon process 33 32.5  Olecranon process 27 27.3  10 cm proximal to ulnar styloid process 26.5 25.1  Just proximal to ulnar styloid process 17.1 17.3  Across hand at thumb web space 20.3 20.1  At base of 2nd digit  6.8 6.5  (Blank rows = not tested)   LANDMARK LEFT   eval LEFT 04/02/2023  10 cm proximal to olecranon process 32.7 32.5  Olecranon process 26.9 26.5  10 cm proximal to ulnar styloid process 25.1 24.9  Just proximal to ulnar styloid process 17.3 16.8  Across hand at thumb web  space 19.7 20.3  At base of 2nd digit 6.4 6.3  (Blank rows = not tested)  Surgery type/Date: Right lumpectomy and sentinel node biopsy 02/07/2023 Number of lymph nodes removed: 3 Current/past treatment (chemo, radiation, hormone therapy): Currently undergoing radiation Other symptoms:  Heaviness/tightness Yes Pain Yes Pitting edema No Infections No Decreased scar mobility Yes Stemmer sign No  PATIENT EDUCATION:  Education details: Neural stretches for right UE Person educated: Patient Education method: Explanation, Demonstration, and Handouts Education comprehension: verbalized understanding, returned demonstration, and verbal cues required  HOME EXERCISE PROGRAM: Reviewed previously given post op HEP. Issued new HEP to address neural tension..  ASSESSMENT:  CLINICAL IMPRESSION: Patient has healed well s/p right lumpectomy and sentinel node biopsy on 02/07/2023. She had 3 negative nodes removed. She is currently undergoing radiation and has recently developed neural tension in her right UE extending primarily from her hand to elbow but also has medial upper arm pain with neural stretch. She has regained shoulder ROM and function but this tightness and pain in her right arm is limiting her functionally. She would benefit from PT but wants to try a HEP of neural stretching for 1 month while completing radiation and then return for a reassessment and PT is agreeable to this plan.  Pt will benefit from skilled therapeutic intervention to improve on the following deficits: Decreased knowledge of precautions, impaired UE functional use, pain, decreased ROM, postural dysfunction.   PT treatment/interventions: ADL/Self care home management, [phone]- PT Re-evaluation, 97110-Therapeutic exercises, 97530- Therapeutic activity, 97535- Self Care, 62831- Manual therapy, Manual lymph drainage, and Scar mobilization   GOALS: Goals reviewed with patient? Yes  LONG TERM GOALS:  (STG=LTG)  GOALS  Name Target Date  Goal sta/tus  1 Pt will demonstrate she has regained full shoulder ROM and function post operatively compared to baselines.  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 05/07/2023 INITIAL     PLAN:  PT FREQUENCY/DURATION: 1 reassessment visit on 05/07/2023  PLAN FOR NEXT SESSION: Reassess neural tension in right UE and determine if pt needs to begin PT for manual therapy.   [facility]  [address], [address]  [place] [address]  [phone]  After Breast Cancer Class It is recommended you attend the ABC class to be educated on lymphedema risk reduction. This class is free of charge and lasts for 1 hour. It is a 1-time class. You will need to download the TEAMS app either on your phone or computer. We will send you a link the night before or the morning of the class. You should be able to click on that link to join the class. This is not a confidential class. You don't have to turn your camera on, but other participants may be able to see your email address. I'M GIVING YOU THE INFO SO YOU DON'T NEED TO ATTEND.  Scar massage You can begin gentle scar massage to you incision sites. Gently place one hand on the incision and move the skin (without sliding on the skin) in various directions. Do this for a few  minutes and then you can gently massage either coconut oil or vitamin E cream into the scars.  Home exercise Program Continue doing the exercises you were given until you feel like you can do them without feeling any tightness at the end. ALSO ADD THE NERVE STRETCHES GIVEN TODAY.  Walking Program Studies show that 30 minutes of walking per day (fast enough to elevate your heart rate) can significantly reduce the risk of a cancer recurrence. If you can't walk due to other medical reasons, we encourage you to find another activity you could  do (like a stationary bike or water exercise).  Posture After breast cancer surgery, people frequently sit with rounded shoulders posture because it puts their incisions on slack and feels better. If you sit like this and scar tissue forms in that position, you can become very tight and have pain sitting or standing with good posture. Try to be aware of your posture and sit and stand up tall to heal properly.  Follow up PT: It is recommended you return every 3 months for the first 3 years following surgery to be assessed on the SOZO machine for an L-Dex score. This helps prevent clinically significant lymphedema in 95% of patients. These follow up screens are 10 minute appointments that you are not billed for.  Bethann Punches, [place] 04/02/23 12:14 PM

## 2023-04-02 NOTE — Patient Instructions (Addendum)
Neurovascular: Ulnar Nerve Stretch - Supine    Lie with neck supported, right arm out to side, elbow bent to pain-free position, fingers pointing toward ear, palm up. Slowly bend elbow further, as far as possible without pain. Hold __1-2__ seconds. Repeat __6__ times per set. Do __2__ sets per session. Only go to where you feel a stretch but don't push it. MEDIAN NERVE: Mobilization XI    Stand with right palm flat on wall, fingers back, elbow bent, head tilted away. Sidestep away from wall, straightening elbow. Do __2_ sets of __6_ repetitions per session. Do _2Lateral Flexion    With head in comfortable, centered position and chin slightly tucked, gently bring left ear toward left shoulder. Hold __10__ seconds. Repeat with left side. Repeat __3__ times. Do __2__ sessions per day.  http://[email or web address].exer.us/9   Copyright  VHI. All rights reserved.  __ sessions per day.  Copyright  VHI. All rights reserved.    [facility]  [address]  [place] [address]  [phone]  After Breast Cancer Class It is recommended you attend the ABC class to be educated on lymphedema risk reduction. This class is free of charge and lasts for 1 hour. It is a 1-time class. You will need to download the TEAMS app either on your phone or computer. We will send you a link the night before or the morning of the class. You should be able to click on that link to join the class. This is not a confidential class. You don't have to turn your camera on, but other participants may be able to see your email address. I'M GIVING YOU THE INFO SO YOU DON'T NEED TO ATTEND.  Scar massage You can begin gentle scar massage to you incision sites. Gently place one hand on the incision and move the skin (without sliding on the skin) in various directions. Do this for a few minutes and then you can gently massage either coconut oil or vitamin E cream into the scars.  Home exercise  Program Continue doing the exercises you were given until you feel like you can do them without feeling any tightness at the end. ALSO ADD THE NERVE STRETCHES GIVEN TODAY.  Walking Program Studies show that 30 minutes of walking per day (fast enough to elevate your heart rate) can significantly reduce the risk of a cancer recurrence. If you can't walk due to other medical reasons, we encourage you to find another activity you could do (like a stationary bike or water exercise).  Posture After breast cancer surgery, people frequently sit with rounded shoulders posture because it puts their incisions on slack and feels better. If you sit like this and scar tissue forms in that position, you can become very tight and have pain sitting or standing with good posture. Try to be aware of your posture and sit and stand up tall to heal properly.  Follow up PT: It is recommended you return every 3 months for the first 3 years following surgery to be assessed on the SOZO machine for an L-Dex score. This helps prevent clinically significant lymphedema in 95% of patients. These follow up screens are 10 minute appointments that you are not billed for.

## 2023-04-03 ENCOUNTER — Ambulatory Visit
Admission: RE | Admit: 2023-04-03 | Discharge: 2023-04-03 | Disposition: A | Discharge status: Home / Self Care | Payer: 59 | Source: Ambulatory Visit | Attending: Radiation Oncology

## 2023-04-03 ENCOUNTER — Other Ambulatory Visit: Payer: Self-pay

## 2023-04-03 DIAGNOSIS — C50211 Malignant neoplasm of upper-inner quadrant of right female breast: Secondary | ICD-10-CM | POA: Diagnosis not present

## 2023-04-03 LAB — RAD ONC ARIA SESSION SUMMARY
Course Elapsed Days: 7
Plan Fractions Treated to Date: 6
Plan Prescribed Dose Per Fraction: 2.66 Gy
Plan Total Fractions Prescribed: 16
Plan Total Prescribed Dose: 42.56 Gy
Reference Point Dosage Given to Date: 15.96 Gy
Reference Point Session Dosage Given: 2.66 Gy
Session Number: 6

## 2023-04-04 ENCOUNTER — Other Ambulatory Visit: Payer: Self-pay

## 2023-04-04 ENCOUNTER — Ambulatory Visit
Admission: RE | Admit: 2023-04-04 | Discharge: 2023-04-04 | Disposition: A | Discharge status: Home / Self Care | Payer: 59 | Source: Ambulatory Visit | Attending: Radiation Oncology

## 2023-04-04 DIAGNOSIS — C50211 Malignant neoplasm of upper-inner quadrant of right female breast: Secondary | ICD-10-CM | POA: Diagnosis not present

## 2023-04-04 LAB — RAD ONC ARIA SESSION SUMMARY
Course Elapsed Days: 8
Plan Fractions Treated to Date: 7
Plan Prescribed Dose Per Fraction: 2.66 Gy
Plan Total Fractions Prescribed: 16
Plan Total Prescribed Dose: 42.56 Gy
Reference Point Dosage Given to Date: 18.62 Gy
Reference Point Session Dosage Given: 2.66 Gy
Session Number: 7

## 2023-04-05 ENCOUNTER — Ambulatory Visit
Admission: RE | Admit: 2023-04-05 | Discharge: 2023-04-05 | Disposition: A | Discharge status: Home / Self Care | Payer: 59 | Source: Ambulatory Visit | Attending: Radiation Oncology | Admitting: Radiation Oncology

## 2023-04-05 ENCOUNTER — Other Ambulatory Visit: Payer: Self-pay

## 2023-04-05 DIAGNOSIS — C50211 Malignant neoplasm of upper-inner quadrant of right female breast: Secondary | ICD-10-CM | POA: Diagnosis not present

## 2023-04-05 LAB — RAD ONC ARIA SESSION SUMMARY
Course Elapsed Days: 9
Plan Fractions Treated to Date: 8
Plan Prescribed Dose Per Fraction: 2.66 Gy
Plan Total Fractions Prescribed: 16
Plan Total Prescribed Dose: 42.56 Gy
Reference Point Dosage Given to Date: 21.28 Gy
Reference Point Session Dosage Given: 2.66 Gy
Session Number: 8

## 2023-04-06 ENCOUNTER — Ambulatory Visit
Admission: RE | Admit: 2023-04-06 | Discharge: 2023-04-06 | Disposition: A | Discharge status: Home / Self Care | Payer: 59 | Source: Ambulatory Visit | Attending: Radiation Oncology | Admitting: Radiation Oncology

## 2023-04-06 ENCOUNTER — Other Ambulatory Visit: Payer: Self-pay

## 2023-04-06 DIAGNOSIS — C50211 Malignant neoplasm of upper-inner quadrant of right female breast: Secondary | ICD-10-CM | POA: Diagnosis not present

## 2023-04-06 LAB — RAD ONC ARIA SESSION SUMMARY
Course Elapsed Days: 10
Plan Fractions Treated to Date: 9
Plan Prescribed Dose Per Fraction: 2.66 Gy
Plan Total Fractions Prescribed: 16
Plan Total Prescribed Dose: 42.56 Gy
Reference Point Dosage Given to Date: 23.94 Gy
Reference Point Session Dosage Given: 2.66 Gy
Session Number: 9

## 2023-04-08 ENCOUNTER — Other Ambulatory Visit: Payer: Self-pay

## 2023-04-08 ENCOUNTER — Ambulatory Visit
Admission: RE | Admit: 2023-04-08 | Discharge: 2023-04-08 | Disposition: A | Discharge status: Home / Self Care | Payer: 59 | Source: Ambulatory Visit | Attending: Radiation Oncology | Admitting: Radiation Oncology

## 2023-04-08 DIAGNOSIS — C50211 Malignant neoplasm of upper-inner quadrant of right female breast: Secondary | ICD-10-CM | POA: Diagnosis not present

## 2023-04-08 LAB — RAD ONC ARIA SESSION SUMMARY
Course Elapsed Days: 12
Plan Fractions Treated to Date: 10
Plan Prescribed Dose Per Fraction: 2.66 Gy
Plan Total Fractions Prescribed: 16
Plan Total Prescribed Dose: 42.56 Gy
Reference Point Dosage Given to Date: 26.6 Gy
Reference Point Session Dosage Given: 2.66 Gy
Session Number: 10

## 2023-04-09 ENCOUNTER — Ambulatory Visit
Admission: RE | Admit: 2023-04-09 | Discharge: 2023-04-09 | Disposition: A | Discharge status: Home / Self Care | Payer: 59 | Source: Ambulatory Visit | Attending: Radiation Oncology | Admitting: Radiation Oncology

## 2023-04-09 ENCOUNTER — Other Ambulatory Visit: Payer: Self-pay

## 2023-04-09 DIAGNOSIS — C50211 Malignant neoplasm of upper-inner quadrant of right female breast: Secondary | ICD-10-CM | POA: Diagnosis not present

## 2023-04-09 LAB — RAD ONC ARIA SESSION SUMMARY
Course Elapsed Days: 13
Plan Fractions Treated to Date: 11
Plan Prescribed Dose Per Fraction: 2.66 Gy
Plan Total Fractions Prescribed: 16
Plan Total Prescribed Dose: 42.56 Gy
Reference Point Dosage Given to Date: 29.26 Gy
Reference Point Session Dosage Given: 2.66 Gy
Session Number: 11

## 2023-04-10 ENCOUNTER — Ambulatory Visit
Admission: RE | Admit: 2023-04-10 | Discharge: 2023-04-10 | Disposition: A | Discharge status: Home / Self Care | Payer: 59 | Source: Ambulatory Visit | Attending: Radiation Oncology | Admitting: Radiation Oncology

## 2023-04-10 ENCOUNTER — Other Ambulatory Visit: Payer: Self-pay

## 2023-04-10 DIAGNOSIS — C50211 Malignant neoplasm of upper-inner quadrant of right female breast: Secondary | ICD-10-CM | POA: Diagnosis not present

## 2023-04-10 LAB — RAD ONC ARIA SESSION SUMMARY
Course Elapsed Days: 14
Plan Fractions Treated to Date: 12
Plan Prescribed Dose Per Fraction: 2.66 Gy
Plan Total Fractions Prescribed: 16
Plan Total Prescribed Dose: 42.56 Gy
Reference Point Dosage Given to Date: 31.92 Gy
Reference Point Session Dosage Given: 2.66 Gy
Session Number: 12

## 2023-04-11 ENCOUNTER — Ambulatory Visit
Admission: RE | Admit: 2023-04-11 | Discharge: 2023-04-11 | Disposition: A | Discharge status: Home / Self Care | Payer: 59 | Source: Ambulatory Visit | Attending: Radiation Oncology

## 2023-04-11 ENCOUNTER — Ambulatory Visit: Payer: 59 | Admitting: Radiation Oncology

## 2023-04-11 ENCOUNTER — Other Ambulatory Visit: Payer: Self-pay

## 2023-04-11 DIAGNOSIS — C50211 Malignant neoplasm of upper-inner quadrant of right female breast: Secondary | ICD-10-CM | POA: Diagnosis not present

## 2023-04-11 LAB — RAD ONC ARIA SESSION SUMMARY
Course Elapsed Days: 15
Plan Fractions Treated to Date: 13
Plan Prescribed Dose Per Fraction: 2.66 Gy
Plan Total Fractions Prescribed: 16
Plan Total Prescribed Dose: 42.56 Gy
Reference Point Dosage Given to Date: 34.58 Gy
Reference Point Session Dosage Given: 2.66 Gy
Session Number: 13

## 2023-04-16 ENCOUNTER — Other Ambulatory Visit: Payer: Self-pay

## 2023-04-16 ENCOUNTER — Ambulatory Visit
Admission: RE | Admit: 2023-04-16 | Discharge: 2023-04-16 | Disposition: A | Discharge status: Home / Self Care | Payer: 59 | Source: Ambulatory Visit | Attending: Radiation Oncology | Admitting: Radiation Oncology

## 2023-04-16 ENCOUNTER — Other Ambulatory Visit: Payer: Self-pay | Admitting: Radiation Oncology

## 2023-04-16 DIAGNOSIS — C50211 Malignant neoplasm of upper-inner quadrant of right female breast: Secondary | ICD-10-CM | POA: Diagnosis present

## 2023-04-16 DIAGNOSIS — Z170 Estrogen receptor positive status [ER+]: Secondary | ICD-10-CM | POA: Insufficient documentation

## 2023-04-16 DIAGNOSIS — Z510 Encounter for antineoplastic radiation therapy: Secondary | ICD-10-CM | POA: Insufficient documentation

## 2023-04-16 LAB — RAD ONC ARIA SESSION SUMMARY
Course Elapsed Days: 20
Plan Fractions Treated to Date: 14
Plan Prescribed Dose Per Fraction: 2.66 Gy
Plan Total Fractions Prescribed: 16
Plan Total Prescribed Dose: 42.56 Gy
Reference Point Dosage Given to Date: 37.24 Gy
Reference Point Session Dosage Given: 2.66 Gy
Session Number: 14

## 2023-04-16 MED ORDER — PROCHLORPERAZINE MALEATE 10 MG PO TABS: 10.0000 mg | ORAL_TABLET | Freq: Three times a day (TID) | ORAL | 0 refills | Status: DC | PRN

## 2023-04-17 ENCOUNTER — Ambulatory Visit: Payer: 59 | Admitting: Radiation Oncology

## 2023-04-17 ENCOUNTER — Ambulatory Visit
Admission: RE | Admit: 2023-04-17 | Discharge: 2023-04-17 | Disposition: A | Discharge status: Home / Self Care | Payer: 59 | Source: Ambulatory Visit | Attending: Radiation Oncology | Admitting: Radiation Oncology

## 2023-04-17 ENCOUNTER — Other Ambulatory Visit: Payer: Self-pay

## 2023-04-17 DIAGNOSIS — C50211 Malignant neoplasm of upper-inner quadrant of right female breast: Secondary | ICD-10-CM | POA: Diagnosis not present

## 2023-04-17 LAB — RAD ONC ARIA SESSION SUMMARY
Course Elapsed Days: 21
Plan Fractions Treated to Date: 15
Plan Prescribed Dose Per Fraction: 2.66 Gy
Plan Total Fractions Prescribed: 16
Plan Total Prescribed Dose: 42.56 Gy
Reference Point Dosage Given to Date: 39.9 Gy
Reference Point Session Dosage Given: 2.66 Gy
Session Number: 15

## 2023-04-18 ENCOUNTER — Ambulatory Visit
Admission: RE | Admit: 2023-04-18 | Discharge: 2023-04-18 | Disposition: A | Discharge status: Home / Self Care | Payer: 59 | Source: Ambulatory Visit | Attending: Radiation Oncology | Admitting: Radiation Oncology

## 2023-04-18 ENCOUNTER — Other Ambulatory Visit: Payer: Self-pay

## 2023-04-18 DIAGNOSIS — C50211 Malignant neoplasm of upper-inner quadrant of right female breast: Secondary | ICD-10-CM | POA: Diagnosis not present

## 2023-04-18 LAB — RAD ONC ARIA SESSION SUMMARY
Course Elapsed Days: 22
Plan Fractions Treated to Date: 16
Plan Prescribed Dose Per Fraction: 2.66 Gy
Plan Total Fractions Prescribed: 16
Plan Total Prescribed Dose: 42.56 Gy
Reference Point Dosage Given to Date: 42.56 Gy
Reference Point Session Dosage Given: 2.66 Gy
Session Number: 16

## 2023-04-19 ENCOUNTER — Other Ambulatory Visit: Payer: Self-pay

## 2023-04-19 ENCOUNTER — Ambulatory Visit: Payer: 59

## 2023-04-19 DIAGNOSIS — C50211 Malignant neoplasm of upper-inner quadrant of right female breast: Secondary | ICD-10-CM | POA: Diagnosis not present

## 2023-04-19 LAB — RAD ONC ARIA SESSION SUMMARY
Course Elapsed Days: 23
Plan Fractions Treated to Date: 1
Plan Prescribed Dose Per Fraction: 2 Gy
Plan Total Fractions Prescribed: 4
Plan Total Prescribed Dose: 8 Gy
Reference Point Dosage Given to Date: 2 Gy
Reference Point Session Dosage Given: 2 Gy
Session Number: 17

## 2023-04-20 ENCOUNTER — Ambulatory Visit
Admission: RE | Admit: 2023-04-20 | Discharge: 2023-04-20 | Disposition: A | Discharge status: Home / Self Care | Payer: 59 | Source: Ambulatory Visit | Attending: Radiation Oncology

## 2023-04-20 ENCOUNTER — Other Ambulatory Visit: Payer: Self-pay

## 2023-04-20 ENCOUNTER — Ambulatory Visit: Payer: 59

## 2023-04-20 DIAGNOSIS — C50211 Malignant neoplasm of upper-inner quadrant of right female breast: Secondary | ICD-10-CM | POA: Diagnosis not present

## 2023-04-20 LAB — RAD ONC ARIA SESSION SUMMARY
Course Elapsed Days: 24
Plan Fractions Treated to Date: 2
Plan Prescribed Dose Per Fraction: 2 Gy
Plan Total Fractions Prescribed: 4
Plan Total Prescribed Dose: 8 Gy
Reference Point Dosage Given to Date: 4 Gy
Reference Point Session Dosage Given: 2 Gy
Session Number: 18

## 2023-04-23 ENCOUNTER — Other Ambulatory Visit: Payer: Self-pay

## 2023-04-23 ENCOUNTER — Ambulatory Visit
Admission: RE | Admit: 2023-04-23 | Discharge: 2023-04-23 | Disposition: A | Discharge status: Home / Self Care | Payer: 59 | Source: Ambulatory Visit | Attending: Radiation Oncology | Admitting: Radiation Oncology

## 2023-04-23 ENCOUNTER — Ambulatory Visit: Payer: 59

## 2023-04-23 DIAGNOSIS — C50211 Malignant neoplasm of upper-inner quadrant of right female breast: Secondary | ICD-10-CM | POA: Diagnosis not present

## 2023-04-23 LAB — RAD ONC ARIA SESSION SUMMARY
Course Elapsed Days: 27
Plan Fractions Treated to Date: 3
Plan Prescribed Dose Per Fraction: 2 Gy
Plan Total Fractions Prescribed: 4
Plan Total Prescribed Dose: 8 Gy
Reference Point Dosage Given to Date: 6 Gy
Reference Point Session Dosage Given: 2 Gy
Session Number: 19

## 2023-04-24 ENCOUNTER — Other Ambulatory Visit: Payer: Self-pay

## 2023-04-24 ENCOUNTER — Ambulatory Visit
Admission: RE | Admit: 2023-04-24 | Discharge: 2023-04-24 | Disposition: A | Discharge status: Home / Self Care | Payer: 59 | Source: Ambulatory Visit | Attending: Radiation Oncology

## 2023-04-24 ENCOUNTER — Ambulatory Visit: Payer: 59

## 2023-04-24 DIAGNOSIS — C50211 Malignant neoplasm of upper-inner quadrant of right female breast: Secondary | ICD-10-CM | POA: Diagnosis not present

## 2023-04-24 LAB — RAD ONC ARIA SESSION SUMMARY
Course Elapsed Days: 28
Plan Fractions Treated to Date: 4
Plan Prescribed Dose Per Fraction: 2 Gy
Plan Total Fractions Prescribed: 4
Plan Total Prescribed Dose: 8 Gy
Reference Point Dosage Given to Date: 8 Gy
Reference Point Session Dosage Given: 2 Gy
Session Number: 20

## 2023-04-25 ENCOUNTER — Ambulatory Visit: Payer: 59

## 2023-04-25 NOTE — Radiation Completion Notes (Addendum)
  Radiation Oncology         [phone] ________________________________  Name: Victoria Cunningham MRN: [id number]  Date of Service: 04/24/2023  DOB: [birth year]  End of Treatment Note    Diagnosis: Stage IA, [id number], grade 2 ER/PR positive invasive ductal carcinoma of the right breast.   Intent: Curative     ==========DELIVERED PLANS==========  First Treatment Date: 2023-03-27 Last Treatment Date: 2023-04-24   Plan Name: Breast_R Site: Breast, Right Technique: 3D Mode: Photon Dose Per Fraction: 2.66 Gy Prescribed Dose (Delivered / Prescribed): 42.56 Gy / 42.56 Gy Prescribed Fxs (Delivered / Prescribed): 16 / 16   Plan Name: Breast_R_Bst Site: Breast, Right Technique: 3D Mode: Photon Dose Per Fraction: 2 Gy Prescribed Dose (Delivered / Prescribed): 8 Gy / 8 Gy Prescribed Fxs (Delivered / Prescribed): 4 / 4     ==========ON TREATMENT VISIT DATES========== 2023-03-30, 2023-04-06, 2023-04-16, 2023-04-20   See weekly On Treatment Notes in Epic for details in the Media tab (listed as Progress notes on the On Treatment Visit Dates listed above).The patient tolerated radiation. She developed fatigue and anticipated skin changes in the treatment field.   The patient will receive a call in about one month from the radiation oncology department. She will continue follow up with Dr. Al Pimple as well.      Osker Mason, PAC

## 2023-04-30 ENCOUNTER — Encounter: Payer: 59 | Admitting: Internal Medicine

## 2023-05-01 ENCOUNTER — Inpatient Hospital Stay: Payer: 59 | Attending: Hematology and Oncology | Admitting: Hematology and Oncology

## 2023-05-01 DIAGNOSIS — Z170 Estrogen receptor positive status [ER+]: Secondary | ICD-10-CM | POA: Insufficient documentation

## 2023-05-01 DIAGNOSIS — C50211 Malignant neoplasm of upper-inner quadrant of right female breast: Secondary | ICD-10-CM | POA: Diagnosis not present

## 2023-05-01 DIAGNOSIS — Z79810 Long term (current) use of selective estrogen receptor modulators (SERMs): Secondary | ICD-10-CM | POA: Insufficient documentation

## 2023-05-01 MED ORDER — TAMOXIFEN CITRATE 20 MG PO TABS: 20.0000 mg | ORAL_TABLET | Freq: Every day | ORAL | 3 refills | Status: DC

## 2023-05-01 NOTE — Progress Notes (Signed)
Cancer Center CONSULT NOTE  Patient Care Team: [facility] as PCP - General Donnelly Angelica, RN as Oncology Nurse Navigator Lu Duffel, Margretta Ditty, RN as Oncology Nurse Navigator Rachel Moulds, MD as Consulting Physician (Hematology and Oncology) Dorothy Puffer, MD as Consulting Physician (Radiation Oncology) Harriette Bouillon, MD as Consulting Physician (General Surgery)  CHIEF COMPLAINTS/PURPOSE OF CONSULTATION:  Newly diagnosed breast cancer  HISTORY OF PRESENTING ILLNESS:  Victoria Cunningham 47 y.o. female is here because of recent diagnosis of right breast IDC  I reviewed her records extensively and collaborated the history with the patient.  SUMMARY OF ONCOLOGIC HISTORY: Oncology History  Malignant neoplasm of upper-inner quadrant of right breast in female, estrogen receptor positive (HCC)  01/03/2023 Mammogram   Suspicious 1.5 cm mass in the upper inner quadrant of the right breast. Targeted ultrasound is performed, showing an irregular hypoechoic mass with shadowing in the right breast at 2 o'clock 5 cm from the nipple measuring 8 x 7 x 8 mm. Sonographic evaluation of the right axilla does not show any enlarged adenopathy.     01/05/2023 Pathology Results   Pathology from the right breast needle core biopsy showed invasive ductal carcinoma grade 2, DCIS grade 2, prognostic showed ER 90% positive moderate to strong staining PR 100% positive strong staining, Ki-67 of 5%, HER2 0+   01/16/2023 Initial Diagnosis   Malignant neoplasm of upper-inner quadrant of right breast in female, estrogen receptor positive (HCC)    Genetic Testing   [facility] CancerNext Panel+RNA was Positive. 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 Report date is 02/07/2023.   The CancerNext gene panel offered by [employer] includes sequencing, rearrangement analysis, and RNA analysis for the following 34 genes:   APC, ATM, AXIN2, BARD1, BMPR1A,  BRCA1, BRCA2, BRIP1, CDH1, CDK4, CDKN2A, CHEK2, DICER1, HOXB13, EPCAM, GREM1, MLH1, MSH2, MSH3, MSH6, MUTYH, NF1, NTHL1, PALB2, PMS2, POLD1, POLE, PTEN, RAD51C, RAD51D, SMAD4, SMARCA4, STK11, and TP53.     This is a very pleasant 47 year old premenopausal female patient with no significant past medical history on Mirena IUD referred to breast MDC given screen mammogram detected breast cancer.  She is very healthy at baseline.  She has 4 children, breast-fed all of them for approximately 2 years each.  No family history of breast cancer.  Family history significant for AL L and dad who had bone marrow transplant.  She denies any major medical issues.   Oncotype Dx of 11, no benefit from adj chemo.  Interval history  The patient, with a history of breast cancer and Lynch syndrome, presents for a discussion of adjuvant hormonal therapy. She is premenopausal and scheduled for a hysterectomy and bilateral oophorectomy in January. She is doing well from radiation, healing well.  MEDICAL HISTORY:  Past Medical History:  Diagnosis Date   Cancer (HCC)    breast   PONV (postoperative nausea and vomiting)     SURGICAL HISTORY: Past Surgical History:  Procedure Laterality Date   BREAST BIOPSY Right 01/05/2023   [place] RT BREAST BX W LOC DEV 1ST LESION IMG BX SPEC US GUIDE 01/05/2023 [facility]   BREAST BIOPSY  02/06/2023   MM RT RADIOACTIVE SEED LOC MAMMO GUIDE 02/06/2023 [facility]   BREAST LUMPECTOMY WITH RADIOACTIVE SEED AND SENTINEL LYMPH NODE BIOPSY Right 02/07/2023   Procedure: RIGHT BREAST SEED LUMPECTOMY, RIGHT SENTINEL LYMPH NODE MAPPING;  Surgeon: Harriette Bouillon, MD;  Location: [facility];  Service: General;  Laterality: Right;   DIAGNOSTIC LAPAROSCOPY      SOCIAL HISTORY: Social History   Socioeconomic History   Marital status: Married    Spouse name: Not on file   Number of children: Not on file   Years of education: Not on file   Highest education level:  Not on file  Occupational History   Not on file  Tobacco Use   Smoking status: Never   Smokeless tobacco: Never  Vaping Use   Vaping status: Never Used  Substance and Sexual Activity   Alcohol use: No   Drug use: No   Sexual activity: Yes    Birth control/protection: None  Other Topics Concern   Not on file  Social History Narrative   Not on file   Social Drivers of Health   Financial Resource Strain: Low Risk  (03/17/2023)   Received from [facility]   Overall Financial Resource Strain (CARDIA)    Difficulty of Paying Living Expenses: Not very hard  Food Insecurity: No Food Insecurity (03/17/2023)   Received from [facility]   Hunger Vital Sign    Worried About Running Out of Food in the Last Year: Never true    Ran Out of Food in the Last Year: Never true  Transportation Needs: No Transportation Needs (03/17/2023)   Received from [facility] - Transportation    Lack of Transportation (Medical): No    Lack of Transportation (Non-Medical): No  Physical Activity: Unknown (03/17/2023)   Received from [facility]   Exercise Vital Sign    Days of Exercise per Week: 0 days    Minutes of Exercise per Session: Not on file  Stress: No Stress Concern Present (03/17/2023)   Received from [facility] - Occupational Stress Questionnaire    Feeling of Stress : Only a little  Social Connections: Socially Integrated (03/17/2023)   Received from [facility]   Social Network    How would you rate your social network (family, work, friends)?: Good participation with social networks  Intimate Partner Violence: Not At Risk (03/17/2023)   Received from [facility]   HITS    Over the last 12 months how often did your partner physically hurt you?: Never    Over the last 12 months how often did your partner insult you or talk down to you?: Never    Over the last 12 months how often did your partner threaten you with physical  harm?: Never    Over the last 12 months how often did your partner scream or curse at you?: Never    FAMILY HISTORY: Family History  Problem Relation Age of Onset   Heart disease Father    Diabetes Father    Acute lymphoblastic leukemia Father 36   Lung cancer Paternal Aunt    Breast cancer Paternal Great-grandmother 48 - 99   Anesthesia problems Neg Hx     ALLERGIES:  has no known allergies.  MEDICATIONS:  Current Outpatient Medications  Medication Sig Dispense Refill   tamoxifen (NOLVADEX) 20 MG tablet Take 1 tablet (20 mg total) by mouth daily. 90 tablet 3   ibuprofen (ADVIL) 800 MG tablet Take 1 tablet (800 mg total) by mouth every 8 (eight) hours as needed. (Patient not taking: Reported on 03/09/2023) 30 tablet 0   levonorgestrel (MIRENA) 20 MCG/DAY IUD 1 each by Intrauterine route once.     oxyCODONE (OXY IR/ROXICODONE) 5 MG immediate release tablet Take 1 tablet (  5 mg total) by mouth every 6 (six) hours as needed for severe pain. (Patient not taking: Reported on 04/02/2023) 15 tablet 0   prochlorperazine (COMPAZINE) 10 MG tablet Take 1 tablet (10 mg total) by mouth every 8 (eight) hours as needed for nausea or vomiting. 30 tablet 0   No current facility-administered medications for this visit.    REVIEW OF SYSTEMS:   Constitutional: Denies fevers, chills or abnormal night sweats Eyes: Denies blurriness of vision, double vision or watery eyes Ears, nose, mouth, throat, and face: Denies mucositis or sore throat Respiratory: Denies cough, dyspnea or wheezes Cardiovascular: Denies palpitation, chest discomfort or lower extremity swelling Gastrointestinal:  Denies nausea, heartburn or change in bowel habits Skin: Denies abnormal skin rashes Lymphatics: Denies new lymphadenopathy or easy bruising Neurological:Denies numbness, tingling or new weaknesses Behavioral/Psych: Mood is stable, no new changes  Breast: Denies any palpable lumps or discharge All other systems were  reviewed with the patient and are negative.  PHYSICAL EXAMINATION: ECOG PERFORMANCE STATUS: 0 - Asymptomatic  Vitals:   05/01/23 1157  BP: (!) 114/53  Pulse: 80  Resp: 16  Temp: 98.6 F (37 C)  SpO2: 100%   Filed Weights   05/01/23 1157  Weight: 226 lb 12.8 oz (102.9 kg)    GENERAL:alert, no distress and comfortable Rest of the PE deferred in lieu of counseling.   Lab Results  Component Value Date   WBC 5.8 01/17/2023   HGB 14.1 01/17/2023   HCT 42.1 01/17/2023   MCV 86.6 01/17/2023   PLT 239 01/17/2023   Lab Results  Component Value Date   NA 139 01/17/2023   K 3.8 01/17/2023   CL 105 01/17/2023   CO2 28 01/17/2023    RADIOGRAPHIC STUDIES: I have personally reviewed the radiological reports and agreed with the findings in the report.  ASSESSMENT AND PLAN:  Malignant neoplasm of upper-inner quadrant of right breast in female, estrogen receptor positive (HCC) This is a very pleasant 47 year old female patient with newly diagnosed right breast invasive ductal carcinoma, grade 2, ER/PR positive, HER2 negative referred to breast MDC for additional recommendations.  Given small tumor measuring 8 x 7 x 8 mm, node-negative T, strong ER/PR positivity she will proceed with upfront surgery followed by consideration for Oncotype DX testing.   Right breast lumpectomy showed multifocal breast cancer, known area of invasive ductal carcinoma as well as a new area of invasive lobular carcinoma noted.  Final margins negative, 3 sentinel lymph nodes negative. She is now post adj radiation and is here to initiate anti estrogen therapy.  Breast Cancer Premenopausal patient with estrogen receptor positive breast cancer. Discussed the mechanism of action and side effects of Tamoxifen. Patient is scheduled for hysterectomy and oophorectomy on June 06, 2023, which may allow for a switch to an aromatase inhibitor. -Start Tamoxifen 20mg  daily. -Stop Tamoxifen on May 28, 2023, prior to  surgery due to increased risk of blood clots. -Resume Tamoxifen once fully ambulatory post-surgery. -Consider switch to an aromatase inhibitor after 2 yrs or after surgery.  Lynch Syndrome Discussed the use of baby aspirin for colorectal cancer prevention. -Continue baby aspirin daily.  Follow-up in approximately 4 months for a survivorship visit to assess tolerance to medication. Alternate follow-up with breast surgeons every 6 months or annually.     All questions were answered. The patient knows to call the clinic with any problems, questions or concerns.    Rachel Moulds, MD 05/01/23

## 2023-05-01 NOTE — Assessment & Plan Note (Addendum)
This is a very pleasant 47 year old female patient with newly diagnosed right breast invasive ductal carcinoma, grade 2, ER/PR positive, HER2 negative referred to breast MDC for additional recommendations.  Given small tumor measuring 8 x 7 x 8 mm, node-negative T, strong ER/PR positivity she will proceed with upfront surgery followed by consideration for Oncotype DX testing.   Right breast lumpectomy showed multifocal breast cancer, known area of invasive ductal carcinoma as well as a new area of invasive lobular carcinoma noted.  Final margins negative, 3 sentinel lymph nodes negative. She is now post adj radiation and is here to initiate anti estrogen therapy.  Breast Cancer Premenopausal patient with estrogen receptor positive breast cancer. Discussed the mechanism of action and side effects of Tamoxifen. Patient is scheduled for hysterectomy and oophorectomy on June 06, 2023, which may allow for a switch to an aromatase inhibitor. -Start Tamoxifen 20mg  daily. -Stop Tamoxifen on May 28, 2023, prior to surgery due to increased risk of blood clots. -Resume Tamoxifen once fully ambulatory post-surgery. -Consider switch to an aromatase inhibitor after 2 yrs or after surgery.  Lynch Syndrome Discussed the use of baby aspirin for colorectal cancer prevention. -Continue baby aspirin daily.  Follow-up in approximately 4 months for a survivorship visit to assess tolerance to medication. Alternate follow-up with breast surgeons every 6 months or annually.

## 2023-05-06 NOTE — Therapy (Deleted)
OUTPATIENT PHYSICAL THERAPY BREAST CANCER POST OP FOLLOW UP   Patient Name: Victoria Cunningham MRN: [id number] DOB:08/02/1975, 47 y.o., female Today's Date: 05/06/2023  END OF SESSION:    Past Medical History:  Diagnosis Date   Cancer (HCC)    breast   PONV (postoperative nausea and vomiting)    Past Surgical History:  Procedure Laterality Date   BREAST BIOPSY Right 01/05/2023   [place] RT BREAST BX W LOC DEV 1ST LESION IMG BX SPEC US GUIDE 01/05/2023 [facility]   BREAST BIOPSY  02/06/2023   MM RT RADIOACTIVE SEED LOC MAMMO GUIDE 02/06/2023 [facility]   BREAST LUMPECTOMY WITH RADIOACTIVE SEED AND SENTINEL LYMPH NODE BIOPSY Right 02/07/2023   Procedure: RIGHT BREAST SEED LUMPECTOMY, RIGHT SENTINEL LYMPH NODE MAPPING;  Surgeon: Harriette Bouillon, MD;  Location: [facility];  Service: General;  Laterality: Right;   DIAGNOSTIC LAPAROSCOPY     Patient Active Problem List   Diagnosis Date Noted   MSH6 positive 02/12/2023   Genetic testing 02/01/2023   Malignant neoplasm of upper-inner quadrant of right breast in female, estrogen receptor positive (HCC) 01/16/2023   Baker's cyst of knee, left 03/16/2021   Indication for care in labor and delivery, antepartum 08/15/2014   Vaginal bleeding in pregnancy    [redacted] weeks gestation of pregnancy     REFERRING PROVIDER: Dr. Harriette Bouillon  REFERRING DIAG: Right breast cancer  THERAPY DIAG:  Malignant neoplasm of upper-inner quadrant of right breast in female, estrogen receptor positive (HCC)  Abnormal posture  Aftercare following surgery for neoplasm  Rationale for Evaluation and Treatment: Rehabilitation  ONSET DATE: 02/07/2023  SUBJECTIVE:                                                                                                                                                                                           SUBJECTIVE STATEMENT:  PERTINENT HISTORY:  Patient was diagnosed on 12/19/2022 with right  grade 2 invasive ductal carcinoma breast cancer. She underwent a right lumpectomy and sentinel node biopsy on 02/07/2023 (3 negative nodes) on 02/07/2023. It is ER/PR positive and HER2 negative with a Ki67 of 5%.   PATIENT GOALS:  Reassess how my recovery is going related to arm function, pain, and swelling.  PAIN:  Are you having pain? Yes: NPRS scale: 4/10 Pain location: right hand to elbow Pain description: sore, sharp Aggravating factors: Worst in morning; forearm supination Relieving factors: Forearm pronation  PRECAUTIONS: Recent Surgery, right UE Lymphedema risk  RED FLAGS: None   ACTIVITY LEVEL / LEISURE: She does not exercise   OBJECTIVE:   PATIENT SURVEYS:  QUICK DASH:  OBSERVATIONS:  POSTURE:  Forward head and rounded shoulders  LYMPHEDEMA ASSESSMENT:    UPPER EXTREMITY AROM/PROM:   A/PROM RIGHT   eval   RIGHT 04/02/2023 RIGHT 05/07/2023  Shoulder extension 57 60   Shoulder flexion 155 153   Shoulder abduction 173 166   Shoulder internal rotation 81 78   Shoulder external rotation 87 86                           (Blank rows = not tested)   A/PROM LEFT   eval  Shoulder extension 53  Shoulder flexion 159  Shoulder abduction 172  Shoulder internal rotation 74  Shoulder external rotation 80                          (Blank rows = not tested)   CERVICAL AROM: All within normal limits   UPPER EXTREMITY STRENGTH: WNL   LYMPHEDEMA ASSESSMENTS:    LANDMARK RIGHT   eval RIGHT 04/02/2023  10 cm proximal to olecranon process 33 32.5  Olecranon process 27 27.3  10 cm proximal to ulnar styloid process 26.5 25.1  Just proximal to ulnar styloid process 17.1 17.3  Across hand at thumb web space 20.3 20.1  At base of 2nd digit 6.8 6.5  (Blank rows = not tested)   LANDMARK LEFT   eval LEFT 04/02/2023  10 cm proximal to olecranon process 32.7 32.5  Olecranon process 26.9 26.5  10 cm proximal to ulnar styloid process 25.1 24.9  Just proximal to  ulnar styloid process 17.3 16.8  Across hand at thumb web space 19.7 20.3  At base of 2nd digit 6.4 6.3  (Blank rows = not tested)  Surgery type/Date: Right lumpectomy and sentinel node biopsy 02/07/2023 Number of lymph nodes removed: 3 Current/past treatment (chemo, radiation, hormone therapy): Currently undergoing radiation Other symptoms:  Heaviness/tightness Yes Pain Yes Pitting edema No Infections No Decreased scar mobility Yes Stemmer sign No  PATIENT EDUCATION:  Education details: Neural stretches for right UE Person educated: Patient Education method: Explanation, Demonstration, and Handouts Education comprehension: verbalized understanding, returned demonstration, and verbal cues required  HOME EXERCISE PROGRAM: Reviewed previously given post op HEP. Issued new HEP to address neural tension..  ASSESSMENT:  CLINICAL IMPRESSION:   Pt will benefit from skilled therapeutic intervention to improve on the following deficits: Decreased knowledge of precautions, impaired UE functional use, pain, decreased ROM, postural dysfunction.   PT treatment/interventions: ADL/Self care home management, [id number]- PT Re-evaluation, 97110-Therapeutic exercises, 97530- Therapeutic activity, 97535- Self Care, 95621- Manual therapy, Manual lymph drainage, and Scar mobilization   GOALS: Goals reviewed with patient? Yes  LONG TERM GOALS:  (STG=LTG)  GOALS Name Target Date  Goal sta/tus  1 Pt will demonstrate she has regained full shoulder ROM and function post operatively compared to baselines.  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 05/07/2023 INITIAL     PLAN:  PT FREQUENCY/DURATION:   PLAN FOR NEXT SESSION:

## 2023-05-07 ENCOUNTER — Ambulatory Visit: Payer: 59 | Attending: Surgery

## 2023-05-07 ENCOUNTER — Ambulatory Visit: Payer: 59 | Admitting: Physical Therapy

## 2023-05-07 VITALS — Wt 224.2 lb

## 2023-05-07 DIAGNOSIS — R293 Abnormal posture: Secondary | ICD-10-CM

## 2023-05-07 DIAGNOSIS — Z483 Aftercare following surgery for neoplasm: Secondary | ICD-10-CM

## 2023-05-07 DIAGNOSIS — C50211 Malignant neoplasm of upper-inner quadrant of right female breast: Secondary | ICD-10-CM

## 2023-05-07 NOTE — Therapy (Addendum)
OUTPATIENT PHYSICAL THERAPY SOZO SCREENING NOTE   Patient Name: Victoria Cunningham MRN: [id number] DOB:09/13/1975, 47 y.o., female Today's Date: 05/07/2023  PCP: [facility] PROVIDER: Harriette Bouillon, MD   PT End of Session - 05/07/23 0857     Visit Number 2   # unchanged due to screen only   Number of Visits 2    PT Start Time 0856    PT Stop Time 0905    PT Time Calculation (min) 9 min    Activity Tolerance Patient tolerated treatment well    Behavior During Therapy [facility] for tasks assessed/performed             Past Medical History:  Diagnosis Date   Cancer (HCC)    breast   PONV (postoperative nausea and vomiting)    Past Surgical History:  Procedure Laterality Date   BREAST BIOPSY Right 01/05/2023   [place] RT BREAST BX W LOC DEV 1ST LESION IMG BX SPEC US GUIDE 01/05/2023 [facility]   BREAST BIOPSY  02/06/2023   MM RT RADIOACTIVE SEED LOC MAMMO GUIDE 02/06/2023 [facility]   BREAST LUMPECTOMY WITH RADIOACTIVE SEED AND SENTINEL LYMPH NODE BIOPSY Right 02/07/2023   Procedure: RIGHT BREAST SEED LUMPECTOMY, RIGHT SENTINEL LYMPH NODE MAPPING;  Surgeon: Harriette Bouillon, MD;  Location: [facility];  Service: General;  Laterality: Right;   DIAGNOSTIC LAPAROSCOPY     Patient Active Problem List   Diagnosis Date Noted   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 She underwent a right  lumpectomy and sentinel node biopsy on 02/07/2023 (3 negative nodes) on 02/07/2023. It is ER/PR positive and HER2 negative with a Ki67 of 5%.   PRECAUTIONS: right UE Lymphedema risk, None  SUBJECTIVE: Pt returns for first 3 month L-Dex screen.   PAIN:  Are you having pain? No    Neldon Mc - 05/07/23 0001     Open a tight or new jar No difficulty    Do heavy household chores (wash walls, wash floors) No difficulty    Carry a shopping bag or briefcase No difficulty    Wash your back No difficulty    Use a knife to cut food No difficulty    Recreational activities in which you take some force or impact through your arm, shoulder, or hand (golf, hammering, tennis) No difficulty    During the past week, to what extent has your arm, shoulder or hand problem interfered with your normal social activities with family, friends, neighbors, or groups? Not at all    During the past week, to what extent has your arm, shoulder or hand problem limited your work or other regular daily activities Not at all    Arm, shoulder, or hand pain. None    Tingling (pins and needles) in your arm, shoulder, or hand None    Difficulty Sleeping No difficulty    DASH Score 0 %  SOZO SCREENING: Patient was assessed today using the SOZO machine to determine the lymphedema index score. This was compared to her baseline score. It was determined that she is within the recommended range when compared to her baseline and no further action is needed at this time. She will continue SOZO screenings. These are done every 3 months for 2 years post operatively followed by every 6 months for 2 years, and then annually.   L-DEX FLOWSHEETS - 05/07/23 0800       L-DEX LYMPHEDEMA SCREENING   Measurement Type Unilateral    L-DEX MEASUREMENT EXTREMITY Upper Extremity    POSITION  Standing    DOMINANT SIDE Right    At Risk Side Right    BASELINE SCORE (UNILATERAL) -1    L-DEX SCORE (UNILATERAL) 4.9    VALUE CHANGE  (UNILAT) 5.9             PHYSICAL THERAPY DISCHARGE SUMMARY  Visits from Start of Care: 2  Current functional level related to goals / functional outcomes: Patient was scheduled to come in for a f/u visit to ensure she had regained full arm function. She was also here for a SOZO screen and reports she is back to baseline and doing well. She does not feel the need for the PT visit.   Remaining deficits: None - scored 0% on DASH   Education / Equipment: HEP and lymphedema education   Patient agrees to discharge. Patient goals were met. Patient is being discharged due to meeting the stated rehab goals.  Bethann Punches, [place] 05/07/23 9:25 AM

## 2023-05-28 ENCOUNTER — Ambulatory Visit
Admission: RE | Admit: 2023-05-28 | Discharge: 2023-05-28 | Disposition: A | Discharge status: Home / Self Care | Payer: 59 | Source: Ambulatory Visit | Attending: Radiation Oncology | Admitting: Radiation Oncology

## 2023-05-28 DIAGNOSIS — Z170 Estrogen receptor positive status [ER+]: Secondary | ICD-10-CM | POA: Insufficient documentation

## 2023-05-28 DIAGNOSIS — Z510 Encounter for antineoplastic radiation therapy: Secondary | ICD-10-CM | POA: Insufficient documentation

## 2023-05-28 DIAGNOSIS — C50211 Malignant neoplasm of upper-inner quadrant of right female breast: Secondary | ICD-10-CM | POA: Insufficient documentation

## 2023-05-28 NOTE — Progress Notes (Signed)
  Radiation Oncology         [phone] ________________________________  Name: Victoria Cunningham MRN: [id number]  Date of Service: 05/28/2023  DOB: Oct 10, 1975  Post Treatment Telephone Note  Diagnosis:  Stage IA, [id number], grade 2 ER/PR positive invasive ductal carcinoma of the right breast. (as documented in provider EOT note)  The patient was not available for call today.   The patient was encouraged to avoid sun exposure in the area of prior treatment for up to one year following radiation with either sunscreen or by the style of clothing worn in the sun.  The patient has scheduled follow up with her medical oncologist Dr. Loretha  for ongoing surveillance, and was encouraged to call if she develops concerns or questions regarding radiation.    Rosaline Minerva, LPN

## 2023-06-06 ENCOUNTER — Other Ambulatory Visit: Payer: Self-pay | Admitting: Obstetrics and Gynecology

## 2023-06-06 HISTORY — PX: TOTAL ABDOMINAL HYSTERECTOMY W/ BILATERAL SALPINGOOPHORECTOMY: SHX83

## 2023-06-08 LAB — SURGICAL PATHOLOGY

## 2023-08-06 ENCOUNTER — Ambulatory Visit: Payer: 59 | Attending: Surgery

## 2023-08-06 VITALS — Wt 225.1 lb

## 2023-08-06 DIAGNOSIS — Z483 Aftercare following surgery for neoplasm: Secondary | ICD-10-CM | POA: Insufficient documentation

## 2023-08-06 NOTE — Therapy (Signed)
  OUTPATIENT PHYSICAL THERAPY SOZO SCREENING NOTE   Patient Name: Victoria Cunningham MRN: [id number] DOB:10-04-1975, 48 y.o., female Today's Date: 08/06/2023  PCP: [facility] PROVIDER: Harriette Bouillon, MD   PT End of Session - 08/06/23 [phone]     Visit Number 2   # unchanged due to screen only   PT Start Time 0855    PT Stop Time 0859    PT Time Calculation (min) 4 min    Activity Tolerance Patient tolerated treatment well    Behavior During Therapy [facility] for tasks assessed/performed             Past Medical History:  Diagnosis Date   Cancer (HCC)    breast   PONV (postoperative nausea and vomiting)    Past Surgical History:  Procedure Laterality Date   BREAST BIOPSY Right 01/05/2023   [place] RT BREAST BX W LOC DEV 1ST LESION IMG BX SPEC US GUIDE 01/05/2023 [facility]   BREAST BIOPSY  02/06/2023   MM RT RADIOACTIVE SEED LOC MAMMO GUIDE 02/06/2023 [facility]   BREAST LUMPECTOMY WITH RADIOACTIVE SEED AND SENTINEL LYMPH NODE BIOPSY Right 02/07/2023   Procedure: RIGHT BREAST SEED LUMPECTOMY, RIGHT SENTINEL LYMPH NODE MAPPING;  Surgeon: Harriette Bouillon, MD;  Location: [facility];  Service: General;  Laterality: Right;   DIAGNOSTIC LAPAROSCOPY     Patient Active Problem List   Diagnosis Date Noted   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 She underwent a right lumpectomy and sentinel node  biopsy on 02/07/2023 (3 negative nodes) on 02/07/2023. It is ER/PR positive and HER2 negative with a Ki67 of 5%.   PRECAUTIONS: right UE Lymphedema risk, None  SUBJECTIVE: Pt returns for 3 month L-Dex screen.   PAIN:  Are you having pain? No      SOZO SCREENING: Patient was assessed today using the SOZO machine to determine the lymphedema index score. This was compared to her baseline score. It was determined that she is within the recommended range when compared to her baseline and no further action is needed at this time. She will continue SOZO screenings. These are done every 3 months for 2 years post operatively followed by every 6 months for 2 years, and then annually.   L-DEX FLOWSHEETS - 08/06/23 0800       L-DEX LYMPHEDEMA SCREENING   Measurement Type Unilateral    L-DEX MEASUREMENT EXTREMITY Upper Extremity    POSITION  Standing    DOMINANT SIDE Right    At Risk Side Right    BASELINE SCORE (UNILATERAL) -1    L-DEX SCORE (UNILATERAL) 4.8    VALUE CHANGE (UNILAT) 5.8            Berna Spare, PTA 08/06/23 8:59 AM

## 2023-09-05 ENCOUNTER — Encounter: Payer: Self-pay | Admitting: Adult Health

## 2023-09-05 ENCOUNTER — Inpatient Hospital Stay: Payer: 59 | Attending: Adult Health | Admitting: Adult Health

## 2023-09-05 VITALS — BP 134/87 | HR 72 | Temp 98.5°F | Resp 18 | Ht 63.0 in | Wt 225.4 lb

## 2023-09-05 DIAGNOSIS — C50211 Malignant neoplasm of upper-inner quadrant of right female breast: Secondary | ICD-10-CM | POA: Diagnosis present

## 2023-09-05 DIAGNOSIS — Z9189 Other specified personal risk factors, not elsewhere classified: Secondary | ICD-10-CM | POA: Diagnosis not present

## 2023-09-05 DIAGNOSIS — Z170 Estrogen receptor positive status [ER+]: Secondary | ICD-10-CM | POA: Diagnosis not present

## 2023-09-05 DIAGNOSIS — Z79810 Long term (current) use of selective estrogen receptor modulators (SERMs): Secondary | ICD-10-CM | POA: Diagnosis not present

## 2023-09-05 NOTE — Progress Notes (Signed)
 SURVIVORSHIP VISIT:  BRIEF ONCOLOGIC HISTORY:  Oncology History  Malignant neoplasm of upper-inner quadrant of right breast in female, estrogen receptor positive (HCC)  01/03/2023 Mammogram   Suspicious 1.5 cm mass in the upper inner quadrant of the right breast. Targeted ultrasound is performed, showing an irregular hypoechoic mass with shadowing in the right breast at 2 o'clock 5 cm from the nipple measuring 8 x 7 x 8 mm. Sonographic evaluation of the right axilla does not show any enlarged adenopathy.     01/05/2023 Pathology Results   Pathology from the right breast needle core biopsy showed invasive ductal carcinoma grade 2, DCIS grade 2, prognostic showed ER 90% positive moderate to strong staining PR 100% positive strong staining, Ki-67 of 5%, HER2 0+   01/16/2023 Initial Diagnosis   Malignant neoplasm of upper-inner quadrant of right breast in female, estrogen receptor positive (HCC)    Genetic Testing   [facility] CancerNext Panel+RNA was Positive. 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 Report date is 02/07/2023.   The CancerNext gene panel offered by [employer] includes sequencing, rearrangement analysis, and RNA analysis for the following 34 genes:   APC, ATM, AXIN2, BARD1, BMPR1A, BRCA1, BRCA2, BRIP1, CDH1, CDK4, CDKN2A, CHEK2, DICER1, HOXB13, EPCAM, GREM1, MLH1, MSH2, MSH3, MSH6, MUTYH, NF1, NTHL1, PALB2, PMS2, POLD1, POLE, PTEN, RAD51C, RAD51D, SMAD4, SMARCA4, STK11, and TP53.    03/27/2023 - 04/24/2023 Radiation Therapy   Plan Name: Breast_R Site: Breast, Right Technique: 3D Mode: Photon Dose Per Fraction: 2.66 Gy Prescribed Dose (Delivered / Prescribed): 42.56 Gy / 42.56 Gy Prescribed Fxs (Delivered / Prescribed): 16 / 16   Plan Name: Breast_R_Bst Site: Breast, Right Technique: 3D Mode: Photon Dose Per Fraction: 2 Gy Prescribed Dose (Delivered / Prescribed): 8 Gy / 8 Gy Prescribed Fxs (Delivered / Prescribed): 4 / 4   05/07/2023  Cancer Staging   Staging form: Breast, AJCC 8th Edition - Pathologic stage from 05/07/2023: Stage IA (pT1c, pN0(f), cM0, G2, ER+, PR+, HER2-, Oncotype DX score: 11) - Signed by Bettejane Brownie, PA-C on 05/07/2023 Method of lymph node assessment: Core biopsy Multigene prognostic tests performed: Oncotype DX Recurrence score range: Greater than or equal to 11 Histologic grading system: 3 grade system   04/2023 -  Anti-estrogen oral therapy   Tamoxifen      INTERVAL HISTORY:  Ms. Haston to review her survivorship care plan detailing her treatment course for breast cancer, as well as monitoring long-term side effects of that treatment, education regarding health maintenance, screening, and overall wellness and health promotion.     Overall, Ms. Blacksher reports feeling quite well. She is taking Tamoxifen  daily.  REVIEW OF SYSTEMS:  Review of Systems  Constitutional:  Negative for appetite change, chills, fatigue, fever and unexpected weight change.  HENT:   Negative for hearing loss, lump/mass and trouble swallowing.   Eyes:  Negative for eye problems and icterus.  Respiratory:  Negative for chest tightness, cough and shortness of breath.   Cardiovascular:  Negative for chest pain, leg swelling and palpitations.  Gastrointestinal:  Negative for abdominal distention, abdominal pain, constipation, diarrhea, nausea and vomiting.  Endocrine: Negative for hot flashes.  Genitourinary:  Negative for difficulty urinating.   Musculoskeletal:  Negative for arthralgias.  Skin:  Negative for itching and rash.  Neurological:  Negative for dizziness, extremity weakness, headaches and numbness.  Hematological:  Negative for adenopathy. Does not bruise/bleed easily.  Psychiatric/Behavioral:  Negative for depression. The patient is not nervous/anxious.    Breast: Denies  any new nodularity, masses, tenderness, nipple changes, or nipple discharge.       PAST MEDICAL/SURGICAL HISTORY:  Past Medical  History:  Diagnosis Date   Cancer (HCC)    breast   PONV (postoperative nausea and vomiting)    Past Surgical History:  Procedure Laterality Date   BREAST BIOPSY Right 01/05/2023   US  RT BREAST BX W LOC DEV 1ST LESION IMG BX SPEC US  GUIDE 01/05/2023 [facility]   BREAST BIOPSY  02/06/2023   MM RT RADIOACTIVE SEED LOC MAMMO GUIDE 02/06/2023 [facility]   BREAST LUMPECTOMY WITH RADIOACTIVE SEED AND SENTINEL LYMPH NODE BIOPSY Right 02/07/2023   Procedure: RIGHT BREAST SEED LUMPECTOMY, RIGHT SENTINEL LYMPH NODE MAPPING;  Surgeon: Sim Dryer, MD;  Location: [facility];  Service: General;  Laterality: Right;   DIAGNOSTIC LAPAROSCOPY       ALLERGIES:  No Known Allergies   CURRENT MEDICATIONS:  Outpatient Encounter Medications as of 09/05/2023  Medication Sig   tamoxifen  (NOLVADEX ) 20 MG tablet Take 1 tablet (20 mg total) by mouth daily.   [DISCONTINUED] ibuprofen  (ADVIL ) 800 MG tablet Take 1 tablet (800 mg total) by mouth every 8 (eight) hours as needed. (Patient not taking: Reported on 03/09/2023)   [DISCONTINUED] levonorgestrel (MIRENA) 20 MCG/DAY IUD 1 each by Intrauterine route once.   [DISCONTINUED] oxyCODONE  (OXY IR/ROXICODONE ) 5 MG immediate release tablet Take 1 tablet (5 mg total) by mouth every 6 (six) hours as needed for severe pain. (Patient not taking: Reported on 04/02/2023)   [DISCONTINUED] prochlorperazine  (COMPAZINE ) 10 MG tablet Take 1 tablet (10 mg total) by mouth every 8 (eight) hours as needed for nausea or vomiting. (Patient not taking: Reported on 09/05/2023)   No facility-administered encounter medications on file as of 09/05/2023.     ONCOLOGIC FAMILY HISTORY:  Family History  Problem Relation Age of Onset   Heart disease Father    Diabetes Father    Acute lymphoblastic leukemia Father 53   Lung cancer Paternal Aunt    Breast cancer Paternal Great-grandmother 32 - 30   Anesthesia problems Neg Hx      SOCIAL HISTORY:  Social  History   Socioeconomic History   Marital status: Married    Spouse name: Not on file   Number of children: Not on file   Years of education: Not on file   Highest education level: Not on file  Occupational History   Not on file  Tobacco Use   Smoking status: Never   Smokeless tobacco: Never  Vaping Use   Vaping status: Never Used  Substance and Sexual Activity   Alcohol use: No   Drug use: No   Sexual activity: Yes    Birth control/protection: None  Other Topics Concern   Not on file  Social History Narrative   Not on file   Social Drivers of Health   Financial Resource Strain: Low Risk  (03/17/2023)   Received from [facility]   Overall Financial Resource Strain (CARDIA)    Difficulty of Paying Living Expenses: Not very hard  Food Insecurity: No Food Insecurity (03/17/2023)   [facility]   Hunger Vital Sign    Worried About Running Out of Food in the Last Year: Never true    Ran Out of Food in the Last Year: Never true  Transportation Needs: No Transportation Needs (03/17/2023)   [facility] from [facility] - Transportation    Lack of Transportation (Medical): No    Lack of  Transportation (Non-Medical): No  Physical Activity: Unknown (03/17/2023)   Received from [facility]   Exercise Vital Sign    Days of Exercise per Week: 0 days    Minutes of Exercise per Session: Not on file  Stress: No Stress Concern Present (03/17/2023)   Received from [facility] of Occupational [facility] - Occupational Stress Questionnaire    Feeling of Stress : Only a little  Social Connections: Socially Integrated (03/17/2023)   Received from [facility]   Social Network    How would you rate your social network (family, work, friends)?: Good participation with social networks  Intimate Partner Violence: Not At Risk (03/17/2023)   Received from [facility]   HITS    Over the last 12 months how often did your partner physically hurt  you?: Never    Over the last 12 months how often did your partner insult you or talk down to you?: Never    Over the last 12 months how often did your partner threaten you with physical harm?: Never    Over the last 12 months how often did your partner scream or curse at you?: Never     OBSERVATIONS/OBJECTIVE:  BP 134/87 (BP Location: Left Arm, Patient Position: Sitting)   Pulse 72   Temp 98.5 F (36.9 C) (Temporal)   Resp 18   Ht 5\' 3"  (1.6 m)   Wt 225 lb 6.4 oz (102.2 kg)   SpO2 98%   BMI 39.93 kg/m  GENERAL: Patient is a well appearing female in no acute distress HEENT:  Sclerae anicteric.  Oropharynx clear and moist. No ulcerations or evidence of oropharyngeal candidiasis. Neck is supple.  NODES:  No cervical, supraclavicular, or axillary lymphadenopathy palpated.  BREAST EXAM:  Deferred. LUNGS:  Clear to auscultation bilaterally.  No wheezes or rhonchi. HEART:  Regular rate and rhythm. No murmur appreciated. ABDOMEN:  Soft, nontender.  Positive, normoactive bowel sounds. No organomegaly palpated. MSK:  No focal spinal tenderness to palpation. Full range of motion bilaterally in the upper extremities. EXTREMITIES:  No peripheral edema.   SKIN:  Clear with no obvious rashes or skin changes. No nail dyscrasia. NEURO:  Nonfocal. Well oriented.  Appropriate affect.   LABORATORY DATA:  None for this visit.  DIAGNOSTIC IMAGING:  None for this visit.      ASSESSMENT AND PLAN:  Ms.. Usery is a pleasant 48 y.o. 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  She presents to the Survivorship Clinic for our initial meeting and routine follow-up post-completion of treatment for breast cancer.    1. Stage IA right breast cancer:  Ms. Alejo is continuing to recover from definitive treatment for  breast cancer. She will follow-up with her medical oncologist, Dr. Arno Bibles in 6 months with history and physical exam per surveillance protocol.  She will continue her anti-estrogen therapy with Tamoxifen . Thus far, she is tolerating the Tamoxifen  well, with minimal side effects. Her mammogram is due 11/2023; orders placed today.  Due to her age being less than 68, she is also recommended breast MRI in 05/2024; orders placed today. Today, a comprehensive survivorship care plan and treatment summary was reviewed with the patient today detailing her breast cancer diagnosis, treatment course, potential late/long-term effects of treatment, appropriate follow-up care with recommendations for the future, and patient education resources.  A copy of this  summary, along with a letter will be sent to the patient's primary care provider via mail/fax/In Basket message after today's visit.    2. Lynch syndrome: We reviewed the guidelines which include--annual urinalysis; colonoscopy in [place] due in 03/2025 (every 2 years) and endoscopy due in 03/2027 (every 4 years); dermatology f/u annually (due 06/2024), and is s/p TAH/BSO 06/06/2023 with benign pathology.  3. Bone health:    She was given education on specific activities to promote bone health. Now that she is s/p TAH/BSO will begin bone density testing in the next 1-2 years.  4. Cancer screening:  Due to Ms. Dubeau's history and her age, she should receive screening for skin cancers, colon cancer, and gynecologic cancers.  The information and recommendations are listed on the patient's comprehensive care plan/treatment summary and were reviewed in detail with the patient.    5. Health maintenance and wellness promotion: Ms. Henne was encouraged to consume 5-7 servings of fruits and vegetables per day. We reviewed the "Nutrition Rainbow" handout.  She was also encouraged to engage in moderate to vigorous exercise for 30 minutes per day most days of the week.  She was  instructed to limit her alcohol consumption and continue to abstain from tobacco use.     6. Support services/counseling: It is not uncommon for this period of the patient's cancer care trajectory to be one of many emotions and stressors.   She was given information regarding our available services and encouraged to contact me with any questions or for help enrolling in any of our support group/programs.    Follow up instructions:    -Return to cancer center in 6 months for f/u with Dr. Arno Bibles -Mammogram due in 11/2023 -Breast MRI 05/2024 -She is welcome to return back to the Survivorship Clinic at any time; no additional follow-up needed at this time.  -Consider referral back to survivorship as a long-term survivor for continued surveillance  The patient was provided an opportunity to ask questions and all were answered. The patient agreed with the plan and demonstrated an understanding of the instructions.   Total encounter time:45 minutes*in face-to-face visit time, chart review, lab review, care coordination, order entry, and documentation of the encounter time.   Alwin Baars, NP 09/05/23 10:56 AM Medical Oncology and Hematology [facility] [address] [place], [place] [address] Tel. [phone]    Fax. [phone]  *Total Encounter Time as defined by the Centers for Medicare and Medicaid Services includes, in addition to the face-to-face time of a patient visit (documented in the note above) non-face-to-face time: obtaining and reviewing outside history, ordering and reviewing medications, tests or procedures, care coordination (communications with other health care professionals or caregivers) and documentation in the medical record.

## 2023-11-05 ENCOUNTER — Ambulatory Visit: Attending: Surgery

## 2023-11-05 VITALS — Wt 224.4 lb

## 2023-11-05 DIAGNOSIS — Z483 Aftercare following surgery for neoplasm: Secondary | ICD-10-CM | POA: Insufficient documentation

## 2023-11-05 NOTE — Therapy (Signed)
  OUTPATIENT PHYSICAL THERAPY SOZO SCREENING NOTE   Patient Name: Victoria Cunningham MRN: [id number] DOB:[birth year], 48 y.o., female Today's Date: 11/05/2023  PCP: [facility] PROVIDER: Vanderbilt Ned, MD   PT End of Session - 11/05/23 0908     Visit Number 2   # unchanged due to screen only   PT Start Time 0906    PT Stop Time 0909    PT Time Calculation (min) 3 min    Activity Tolerance Patient tolerated treatment well    Behavior During Therapy [facility] for tasks assessed/performed          Past Medical History:  Diagnosis Date   Cancer (HCC)    breast   PONV (postoperative nausea and vomiting)    Past Surgical History:  Procedure Laterality Date   BREAST BIOPSY Right 01/05/2023   US  RT BREAST BX W LOC DEV 1ST LESION IMG BX SPEC US  GUIDE 01/05/2023 [facility]   BREAST BIOPSY  02/06/2023   MM RT RADIOACTIVE SEED LOC MAMMO GUIDE 02/06/2023 [facility]   BREAST LUMPECTOMY WITH RADIOACTIVE SEED AND SENTINEL LYMPH NODE BIOPSY Right 02/07/2023   Procedure: RIGHT BREAST SEED LUMPECTOMY, RIGHT SENTINEL LYMPH NODE MAPPING;  Surgeon: Vanderbilt Ned, MD;  Location: [facility];  Service: General;  Laterality: Right;   DIAGNOSTIC LAPAROSCOPY     TOTAL ABDOMINAL HYSTERECTOMY W/ BILATERAL SALPINGOOPHORECTOMY Bilateral 06/06/2023   Dr. 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 She underwent a right lumpectomy and sentinel node biopsy on 02/07/2023 (3 negative nodes) on 02/07/2023. It is  ER/PR positive and HER2 negative with a Ki67 of 5%.   PRECAUTIONS: right UE Lymphedema risk, None  SUBJECTIVE: Pt returns for 3 month L-Dex screen.   PAIN:  Are you having pain? No      SOZO SCREENING: Patient was assessed today using the SOZO machine to determine the lymphedema index score. This was compared to her baseline score. It was determined that she is within the recommended range when compared to her baseline and no further action is needed at this time. She will continue SOZO screenings. These are done every 3 months for 2 years post operatively followed by every 6 months for 2 years, and then annually.   L-DEX FLOWSHEETS - 11/05/23 0900       L-DEX LYMPHEDEMA SCREENING   Measurement Type Unilateral    L-DEX MEASUREMENT EXTREMITY Upper Extremity    POSITION  Standing    DOMINANT SIDE Right    At Risk Side Right    BASELINE SCORE (UNILATERAL) -1    L-DEX SCORE (UNILATERAL) 2.3    VALUE CHANGE (UNILAT) 3.3         Berwyn Knights, PTA 11/05/23 9:09 AM

## 2023-11-22 ENCOUNTER — Telehealth: Payer: Self-pay | Admitting: Hematology and Oncology

## 2023-11-22 NOTE — Telephone Encounter (Signed)
 Rescheule appointment per provider pal.  Called left VM with changes made to the upcoming appointment.

## 2024-01-09 ENCOUNTER — Ambulatory Visit
Admission: RE | Admit: 2024-01-09 | Discharge: 2024-01-09 | Disposition: A | Discharge status: Home / Self Care | Source: Ambulatory Visit | Attending: Adult Health | Admitting: Adult Health

## 2024-01-09 DIAGNOSIS — C50211 Malignant neoplasm of upper-inner quadrant of right female breast: Secondary | ICD-10-CM

## 2024-01-09 HISTORY — DX: Personal history of irradiation: Z92.3

## 2024-02-04 ENCOUNTER — Ambulatory Visit: Attending: Surgery

## 2024-02-04 VITALS — Wt 219.5 lb

## 2024-02-04 DIAGNOSIS — Z483 Aftercare following surgery for neoplasm: Secondary | ICD-10-CM | POA: Insufficient documentation

## 2024-02-04 NOTE — Therapy (Signed)
  OUTPATIENT PHYSICAL THERAPY SOZO SCREENING NOTE   Patient Name: Victoria Cunningham MRN: [id number] DOB:14-Feb-1976, 48 y.o., female Today's Date: 02/04/2024  PCP: [facility] PROVIDER: Vanderbilt Ned, MD   PT End of Session - 02/04/24 1539     Visit Number 2   # unchanged due to screen only   PT Start Time 1537    PT Stop Time 1541    PT Time Calculation (min) 4 min    Activity Tolerance Patient tolerated treatment well    Behavior During Therapy [facility] for tasks assessed/performed          Past Medical History:  Diagnosis Date   Cancer (HCC)    breast   Personal history of radiation therapy    PONV (postoperative nausea and vomiting)    Past Surgical History:  Procedure Laterality Date   BREAST BIOPSY Right 01/05/2023   US  RT BREAST BX W LOC DEV 1ST LESION IMG BX SPEC US  GUIDE 01/05/2023 [facility]   BREAST BIOPSY  02/06/2023   MM RT RADIOACTIVE SEED LOC MAMMO GUIDE 02/06/2023 [facility]   BREAST LUMPECTOMY WITH RADIOACTIVE SEED AND SENTINEL LYMPH NODE BIOPSY Right 02/07/2023   Procedure: RIGHT BREAST SEED LUMPECTOMY, RIGHT SENTINEL LYMPH NODE MAPPING;  Surgeon: Vanderbilt Ned, MD;  Location: [facility];  Service: General;  Laterality: Right;   DIAGNOSTIC LAPAROSCOPY     TOTAL ABDOMINAL HYSTERECTOMY W/ BILATERAL SALPINGOOPHORECTOMY Bilateral 06/06/2023   Dr. 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 She underwent a right lumpectomy and sentinel node biopsy on  02/07/2023 (3 negative nodes) on 02/07/2023. It is ER/PR positive and HER2 negative with a Ki67 of 5%.   PRECAUTIONS: right UE Lymphedema risk, None  SUBJECTIVE: Pt returns for 3 month L-Dex screen.   PAIN:  Are you having pain? No    SOZO SCREENING: Patient was assessed today using the SOZO machine to determine the lymphedema index score. This was compared to her baseline score. It was determined that she is within the recommended range when compared to her baseline and no further action is needed at this time. She will continue SOZO screenings. These are done every 3 months for 2 years post operatively followed by every 6 months for 2 years, and then annually.   L-DEX FLOWSHEETS - 02/04/24 1500       L-DEX LYMPHEDEMA SCREENING   Measurement Type Unilateral    L-DEX MEASUREMENT EXTREMITY Upper Extremity    POSITION  Standing    DOMINANT SIDE Right    At Risk Side Right    BASELINE SCORE (UNILATERAL) -1    L-DEX SCORE (UNILATERAL) 2.6    VALUE CHANGE (UNILAT) 3.6         Berwyn Knights, PTA 02/04/24 3:41 PM

## 2024-02-07 ENCOUNTER — Ambulatory Visit
Admission: EM | Admit: 2024-02-07 | Discharge: 2024-02-07 | Disposition: A | Discharge status: Home / Self Care | Attending: Family Medicine | Admitting: Family Medicine

## 2024-02-07 DIAGNOSIS — J0100 Acute maxillary sinusitis, unspecified: Secondary | ICD-10-CM | POA: Diagnosis not present

## 2024-02-07 MED ORDER — AMOXICILLIN-POT CLAVULANATE 875-125 MG PO TABS: 1.0000 | ORAL_TABLET | Freq: Two times a day (BID) | ORAL | 0 refills | Status: AC

## 2024-02-07 NOTE — ED Provider Notes (Addendum)
 Victoria Cunningham CARE    CSN: [id number] Arrival date & time: 02/07/24  1748      History   Chief Complaint Chief Complaint  Patient presents with   Fever   Cough   Nasal Congestion    HPI Victoria Cunningham is a 48 y.o. female.   HPI 48 year old female presents with fever that began yesterday and cough and congestion that began 1 week ago. PMH significant for right breast cancer and obesity.  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   Genetic testing 02/01/2023   Malignant neoplasm of upper-inner quadrant of right breast in female, estrogen receptor positive (HCC) 01/16/2023    Past Surgical History:  Procedure Laterality Date   ABDOMINAL HYSTERECTOMY     BREAST BIOPSY Right 01/05/2023   US  RT BREAST BX W LOC DEV 1ST LESION IMG BX SPEC US  GUIDE 01/05/2023 [facility]   BREAST BIOPSY  02/06/2023   MM RT RADIOACTIVE SEED LOC MAMMO GUIDE 02/06/2023 [facility]   BREAST LUMPECTOMY WITH RADIOACTIVE SEED AND SENTINEL LYMPH NODE BIOPSY Right 02/07/2023   Procedure: RIGHT BREAST SEED LUMPECTOMY, RIGHT SENTINEL LYMPH NODE MAPPING;  Surgeon: Vanderbilt Ned, MD;  Location: [facility];  Service: General;  Laterality: Right;   DIAGNOSTIC LAPAROSCOPY     TOTAL ABDOMINAL HYSTERECTOMY W/ BILATERAL SALPINGOOPHORECTOMY Bilateral 06/06/2023   Dr. Marget    OB History     Gravida  4   Para  4   Term  4   Preterm  0   AB  0   Living  4      SAB  0   IAB  0   Ectopic  0   Multiple  0   Live Births  4            Home Medications    Prior to Admission medications   Medication Sig Start Date End Date Taking? Authorizing Provider  amoxicillin -clavulanate (AUGMENTIN ) 875-125 MG tablet Take 1 tablet by mouth every 12  (twelve) hours. 02/07/24  Yes Teddy Sharper, FNP  tamoxifen  (NOLVADEX ) 20 MG tablet Take 1 tablet (20 mg total) by mouth daily. 05/01/23   Loretha Ash, MD    Family History Family History  Problem Relation Age of Onset   Heart disease Father    Diabetes Father    Acute lymphoblastic leukemia Father 78   Lung cancer Paternal Aunt    Breast cancer Paternal Great-grandmother 61 - 3   Anesthesia problems Neg Hx    Pancreatic cancer Neg Hx    Brain cancer Neg Hx     Social History Social History   Tobacco Use   Smoking status: Never   Smokeless tobacco: Never  Vaping Use   Vaping status: Never Used  Substance Use Topics   Alcohol use: No   Drug use: No     Allergies   Patient has no known allergies.   Review of Systems Review of Systems  HENT:  Positive for congestion.   Respiratory:  Positive for cough.   All other systems reviewed and are negative.    Physical Exam Triage Vital Signs ED Triage Vitals  Encounter Vitals Group     BP      Girls Systolic BP  Percentile      Girls Diastolic BP Percentile      Boys Systolic BP Percentile      Boys Diastolic BP Percentile      Pulse      Resp      Temp      Temp src      SpO2      Weight      Height      Head Circumference      Peak Flow      Pain Score      Pain Loc      Pain Education      Exclude from Growth Chart    No data found.  Updated Vital Signs BP 123/81 (BP Location: Right Arm)   Pulse 97   Temp 98.2 F (36.8 C) (Oral)   Resp 17   SpO2 97%   Physical Exam Vitals and nursing note reviewed.  Constitutional:      General: She is not in acute distress.    Appearance: Normal appearance. She is normal weight. She is ill-appearing.  HENT:     Head: Normocephalic and atraumatic.     Right Ear: Tympanic membrane and external ear normal.     Left Ear: Tympanic membrane and external ear normal.     Ears:     Comments: Moderate eustachian tube dysfunction noted bilaterally    Nose:      Comments: Turbinates are erythematous/edematous    Mouth/Throat:     Mouth: Mucous membranes are moist.     Pharynx: Oropharynx is clear.  Eyes:     Extraocular Movements: Extraocular movements intact.     Conjunctiva/sclera: Conjunctivae normal.     Pupils: Pupils are equal, round, and reactive to light.  Cardiovascular:     Rate and Rhythm: Normal rate and regular rhythm.     Pulses: Normal pulses.     Heart sounds: Normal heart sounds.  Pulmonary:     Effort: Pulmonary effort is normal.     Breath sounds: Normal breath sounds. No wheezing, rhonchi or rales.  Musculoskeletal:        General: Normal range of motion.  Skin:    General: Skin is warm and dry.  Neurological:     General: No focal deficit present.     Mental Status: She is alert and oriented to person, place, and time. Mental status is at baseline.  Psychiatric:        Mood and Affect: Mood normal.        Behavior: Behavior normal.      UC Treatments / Results  Labs (all labs ordered are listed, but only abnormal results are displayed) Labs Reviewed - No data to display  EKG   Radiology No results found.  Procedures Procedures (including critical care time)  Medications Ordered in UC Medications - No data to display  Initial Impression / Assessment and Plan / UC Course  I have reviewed the triage vital signs and the nursing notes.  Pertinent labs & imaging results that were available during my care of the patient were reviewed by me and considered in my medical decision making (see chart for details).     MDM: 1.  Acute maxillary sinusitis, recurrence not specified-Rx'd Augmentin  875/125 mg tablet: Take 1 tablet twice daily x 7 days. Advised patient to take medication as directed with food to completion.  Encouraged to increase daily water intake to 64 ounces per day while taking this medication.  Advised if  symptoms worsen and/or unresolved please follow-up with your PCP or here for further  evaluation.  Patient discharged home, hemodynamically stable. Final Clinical Impressions(s) / UC Diagnoses   Final diagnoses:  Acute maxillary sinusitis, recurrence not specified     Discharge Instructions      Advised patient to take medication as directed with food to completion.  Encouraged to increase daily water intake to 64 ounces per day while taking this medication.  Advised if symptoms worsen and/or unresolved please follow-up with your PCP or here for further evaluation.     ED Prescriptions     Medication Sig Dispense Auth. Provider   amoxicillin -clavulanate (AUGMENTIN ) 875-125 MG tablet Take 1 tablet by mouth every 12 (twelve) hours. 14 tablet Kamerin Axford, FNP      PDMP not reviewed this encounter.   Teddy Sharper, FNP 02/07/24 Victoria Cunningham    Teddy Sharper, FNP 02/07/24 [phone]

## 2024-02-07 NOTE — ED Triage Notes (Signed)
 Pt c/o fever that started yesterday. Cough and congestion started a week ago. Hoarse. Tylenol , sudafed and mucinex  prn. Hx of pneumonia and bronchitis.

## 2024-02-07 NOTE — Discharge Instructions (Addendum)
 Advised patient to take medication as directed with food to completion.  Encouraged to increase daily water intake to 64 ounces per day while taking this medication.  Advised if symptoms worsen and/or unresolved please follow-up with your PCP or here for further evaluation.

## 2024-02-08 ENCOUNTER — Telehealth: Payer: Self-pay

## 2024-02-08 NOTE — Telephone Encounter (Signed)
Called to check on patient. No answer. Left voicemail.

## 2024-03-03 ENCOUNTER — Ambulatory Visit: Admitting: Hematology and Oncology

## 2024-03-06 ENCOUNTER — Ambulatory Visit: Admitting: Hematology and Oncology

## 2024-03-10 ENCOUNTER — Telehealth: Payer: Self-pay

## 2024-03-10 NOTE — Telephone Encounter (Signed)
 Left message on voicemail about upcoming appointment on 10/28

## 2024-03-11 ENCOUNTER — Telehealth: Payer: Self-pay

## 2024-03-11 ENCOUNTER — Inpatient Hospital Stay: Attending: Hematology and Oncology | Admitting: Hematology and Oncology

## 2024-03-11 VITALS — BP 124/72 | HR 66 | Temp 97.8°F | Resp 17 | Wt 220.1 lb

## 2024-03-11 DIAGNOSIS — Z1501 Genetic susceptibility to malignant neoplasm of breast: Secondary | ICD-10-CM | POA: Diagnosis not present

## 2024-03-11 DIAGNOSIS — Z1721 Progesterone receptor positive status: Secondary | ICD-10-CM | POA: Insufficient documentation

## 2024-03-11 DIAGNOSIS — Z923 Personal history of irradiation: Secondary | ICD-10-CM | POA: Diagnosis not present

## 2024-03-11 DIAGNOSIS — C50211 Malignant neoplasm of upper-inner quadrant of right female breast: Secondary | ICD-10-CM | POA: Insufficient documentation

## 2024-03-11 DIAGNOSIS — Z79899 Other long term (current) drug therapy: Secondary | ICD-10-CM | POA: Diagnosis not present

## 2024-03-11 DIAGNOSIS — Z79810 Long term (current) use of selective estrogen receptor modulators (SERMs): Secondary | ICD-10-CM | POA: Insufficient documentation

## 2024-03-11 DIAGNOSIS — Z9189 Other specified personal risk factors, not elsewhere classified: Secondary | ICD-10-CM

## 2024-03-11 DIAGNOSIS — Z1509 Genetic susceptibility to other malignant neoplasm: Secondary | ICD-10-CM | POA: Diagnosis not present

## 2024-03-11 DIAGNOSIS — Z1732 Human epidermal growth factor receptor 2 negative status: Secondary | ICD-10-CM | POA: Diagnosis not present

## 2024-03-11 DIAGNOSIS — Z170 Estrogen receptor positive status [ER+]: Secondary | ICD-10-CM | POA: Diagnosis not present

## 2024-03-11 NOTE — Progress Notes (Signed)
 BRIEF ONCOLOGIC HISTORY:  Oncology History  Malignant neoplasm of upper-inner quadrant of right breast in female, estrogen receptor positive (HCC)  01/03/2023 Mammogram   Suspicious 1.5 cm mass in the upper inner quadrant of the right breast. Targeted ultrasound is performed, showing an irregular hypoechoic mass with shadowing in the right breast at 2 o'clock 5 cm from the nipple measuring 8 x 7 x 8 mm. Sonographic evaluation of the right axilla does not show any enlarged adenopathy.     01/05/2023 Pathology Results   Pathology from the right breast needle core biopsy showed invasive ductal carcinoma grade 2, DCIS grade 2, prognostic showed ER 90% positive moderate to strong staining PR 100% positive strong staining, Ki-67 of 5%, HER2 0+    Genetic Testing   [facility] CancerNext Panel+RNA was Positive. 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 Report date is 02/07/2023.   The CancerNext gene panel offered by [employer] includes sequencing, rearrangement analysis, and RNA analysis for the following 34 genes:   APC, ATM, AXIN2, BARD1, BMPR1A, BRCA1, BRCA2, BRIP1, CDH1, CDK4, CDKN2A, CHEK2, DICER1, HOXB13, EPCAM, GREM1, MLH1, MSH2, MSH3, MSH6, MUTYH, NF1, NTHL1, PALB2, PMS2, POLD1, POLE, PTEN, RAD51C, RAD51D, SMAD4, SMARCA4, STK11, and TP53.    02/07/2023 Surgery   Right breast lumpectomy: Invasive ductal carcinoma, grade 2 (3+2+1); Tumor size: 1.3 x 1.3 x 1.0 cm, Invasive lobular carcinoma, grade 1 (3+1+1), Tumor size: 0.9 x 0.8 x 0.7 cm ; 3 SLN negative for cancer   03/27/2023 - 04/24/2023 Radiation Therapy   Plan Name: Breast_R Site: Breast, Right Technique: 3D Mode: Photon Dose Per Fraction: 2.66 Gy Prescribed Dose (Delivered / Prescribed): 42.56 Gy / 42.56 Gy Prescribed Fxs (Delivered / Prescribed): 16 / 16   Plan Name: Breast_R_Bst Site: Breast, Right Technique: 3D Mode: Photon Dose Per Fraction: 2 Gy Prescribed Dose (Delivered / Prescribed): 8 Gy  / 8 Gy Prescribed Fxs (Delivered / Prescribed): 4 / 4   05/07/2023 Cancer Staging   Staging form: Breast, AJCC 8th Edition - Pathologic stage from 05/07/2023: Stage IA (pT1c, pN0(f), cM0, G2, ER+, PR+, HER2-, Oncotype DX score: 11) - Signed by Lanell Donald Stagger, PA-C on 05/07/2023 Method of lymph node assessment: Core biopsy Multigene prognostic tests performed: Oncotype DX Recurrence score range: Greater than or equal to 11 Histologic grading system: 3 grade system   04/2023 -  Anti-estrogen oral therapy   Tamoxifen      INTERVAL HISTORY:  Discussed the use of AI scribe software for clinical note transcription with the patient, who gave verbal consent to proceed.  History of Present Illness Victoria Cunningham is a 48 year old female with Lynch syndrome and breast cancer who presents for follow-up regarding her cancer surveillance and treatment.  She is currently on tamoxifen  therapy with no noticeable side effects. Since her hysterectomy, she experiences fewer hot flashes. She reports undergoing colonoscopies every two years and annual urine analyses for Lynch syndrome surveillance. Her next colonoscopy is due next year.  She has a history of breast cancer and underwent a mammogram in August. She is scheduled for an MRI in January. She needs to find a new dermatologist as her previous one relocated to [place] .  Her family history includes Lynch syndrome, necessitating regular screenings for various cancers. She has four children, aged 6, 66, 67, and 35, and discusses their educational paths and extracurricular activities. She teaches music part-time, which allows her some flexibility in managing her family's needs.  Breast: Denies any new nodularity, masses,  tenderness, nipple changes, or nipple discharge.       PAST MEDICAL/SURGICAL HISTORY:  Past Medical History:  Diagnosis Date   Cancer [facility])    breast   Personal history of radiation therapy    PONV (postoperative nausea  and vomiting)    Past Surgical History:  Procedure Laterality Date   ABDOMINAL HYSTERECTOMY     BREAST BIOPSY Right 01/05/2023   US  RT BREAST BX W LOC DEV 1ST LESION IMG BX SPEC US  GUIDE 01/05/2023 [facility]   BREAST BIOPSY  02/06/2023   MM RT RADIOACTIVE SEED LOC MAMMO GUIDE 02/06/2023 [facility]   BREAST LUMPECTOMY WITH RADIOACTIVE SEED AND SENTINEL LYMPH NODE BIOPSY Right 02/07/2023   Procedure: RIGHT BREAST SEED LUMPECTOMY, RIGHT SENTINEL LYMPH NODE MAPPING;  Surgeon: Vanderbilt Ned, MD;  Location: [facility];  Service: General;  Laterality: Right;   DIAGNOSTIC LAPAROSCOPY     TOTAL ABDOMINAL HYSTERECTOMY W/ BILATERAL SALPINGOOPHORECTOMY Bilateral 06/06/2023   Dr. Marget     ALLERGIES:  No Known Allergies   CURRENT MEDICATIONS:  Outpatient Encounter Medications as of 03/11/2024  Medication Sig   amoxicillin -clavulanate (AUGMENTIN ) 875-125 MG tablet Take 1 tablet by mouth every 12 (twelve) hours.   tamoxifen  (NOLVADEX ) 20 MG tablet Take 1 tablet (20 mg total) by mouth daily.   No facility-administered encounter medications on file as of 03/11/2024.     ONCOLOGIC FAMILY HISTORY:  Family History  Problem Relation Age of Onset   Heart disease Father    Diabetes Father    Acute lymphoblastic leukemia Father 14   Lung cancer Paternal Aunt    Breast cancer Paternal Great-grandmother 98 - 22   Anesthesia problems Neg Hx    Pancreatic cancer Neg Hx    Brain cancer Neg Hx      SOCIAL HISTORY:  Social History   Socioeconomic History   Marital status: Married    Spouse name: Not on file   Number of children: Not on file   Years of education: Not on file   Highest education level: Not on file  Occupational History   Not on file  Tobacco Use   Smoking status: Never   Smokeless tobacco: Never  Vaping Use   Vaping status: Never Used  Substance and Sexual Activity   Alcohol use: No   Drug use: No   Sexual activity: Yes    Birth  control/protection: None  Other Topics Concern   Not on file  Social History Narrative   Not on file   Social Drivers of Health   Financial Resource Strain: Low Risk  (03/17/2023)   Received from [facility]   Overall Financial Resource Strain (CARDIA)    Difficulty of Paying Living Expenses: Not very hard  Food Insecurity: No Food Insecurity (03/17/2023)   Received from [facility]   Hunger Vital Sign    Within the past 12 months, you worried that your food would run out before you got the money to buy more.: Never true    Within the past 12 months, the food you bought just didn't last and you didn't have money to get more.: Never true  Transportation Needs: No Transportation Needs (03/17/2023)   Received from [facility] - Transportation    Lack of Transportation (Medical): No    Lack of Transportation (Non-Medical): No  Physical Activity: Unknown (03/17/2023)   Received from [facility]   Exercise Vital Sign    On average, how many days per week do you  engage in moderate to strenuous exercise (like a brisk walk)?: 0 days    Minutes of Exercise per Session: Not on file  Stress: No Stress Concern Present (03/17/2023)   Received from [facility] - Occupational Stress Questionnaire    Feeling of Stress : Only a little  Social Connections: Socially Integrated (03/17/2023)   Received from [facility]   Social Network    How would you rate your social network (family, work, friends)?: Good participation with social networks  Intimate Partner Violence: Not At Risk (03/17/2023)   Received from [facility]   HITS    Over the last 12 months how often did your partner physically hurt you?: Never    Over the last 12 months how often did your partner insult you or talk down to you?: Never    Over the last 12 months how often did your partner threaten you with physical harm?: Never    Over the last 12 months how often did your  partner scream or curse at you?: Never     OBSERVATIONS/OBJECTIVE:  BP 124/72 (BP Location: Left Arm, Patient Position: Sitting)   Pulse 66   Temp 97.8 F (36.6 C) (Temporal)   Resp 17   Wt 220 lb 1.6 oz (99.8 kg)   SpO2 100%   BMI 38.99 kg/m  GENERAL: Patient is a well appearing female in no acute distress Bilateral breasts examined. NO palpable masses No regional adenopathy No LE edema.   LABORATORY DATA:  None for this visit.  DIAGNOSTIC IMAGING:  None for this visit.    ASSESSMENT AND PLAN:  Victoria Cunningham is a pleasant 48 y.o. 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   1. Stage IA right breast cancer:  Victoria Cunningham is continuing to recover from definitive treatment for breast cancer. She is tolerating tamoxifen  extremely well. We can attempt switch therapy to AI after 2 yrs of tamoxifen . She will continue annual mammograms for screening.  2. Lynch syndrome: We reviewed the guidelines which include--annual urinalysis; colonoscopy in [place] due in 03/2025 (every 2 years) and endoscopy due in 03/2027 (every 4 years); dermatology f/u annually (due 06/2024), and is s/p TAH/BSO 06/06/2023 with benign pathology. She is to find a new dermatologist, otherwise she is up to date. She can do UA at her doctor or here.  3. Bone health:    She was given education on specific activities to promote bone health. Now that she is s/p TAH/BSO will begin bone density testing in the next 1-2 years.  4. MRD testing recommended, she will call [employer] and have this scheduled as a mobile blood draw.  Time spent: 30 min  *Total Encounter Time as defined by the Centers for Medicare and Medicaid Services includes, in addition to the face-to-face time of a patient visit (documented in the note above)  non-face-to-face time: obtaining and reviewing outside history, ordering and reviewing medications, tests or procedures, care coordination (communications with other health care professionals or caregivers) and documentation in the medical record.

## 2024-03-11 NOTE — Telephone Encounter (Signed)
 Contacted pt per MD. She would like to come back to [facility] tomorrow to give urine sample since we are close to her workplace. Appt made for 1130.

## 2024-03-11 NOTE — Telephone Encounter (Signed)
 Orders entered for [employer] per MD. Requisition and all supporting documents faxed with confirmation.

## 2024-03-11 NOTE — Telephone Encounter (Signed)
-----   Message from [place] sent at 03/11/2024  1:00 PM EDT ----- Victoria Cunningham  This pt needs a UA for her lynch syndrome.  She can do this here or at PCP. Can u find out if she wants to do with her PCP or here and facilitate it if needed.  Thanks,

## 2024-03-12 ENCOUNTER — Inpatient Hospital Stay

## 2024-03-12 DIAGNOSIS — C50211 Malignant neoplasm of upper-inner quadrant of right female breast: Secondary | ICD-10-CM | POA: Diagnosis not present

## 2024-03-12 LAB — URINALYSIS, COMPLETE (UACMP) WITH MICROSCOPIC
Bacteria, UA: NONE SEEN
Bilirubin Urine: NEGATIVE
Glucose, UA: NEGATIVE mg/dL
Hgb urine dipstick: NEGATIVE
Ketones, ur: NEGATIVE mg/dL
Leukocytes,Ua: NEGATIVE
Nitrite: NEGATIVE
Protein, ur: NEGATIVE mg/dL
Specific Gravity, Urine: 1.023 (ref 1.005–1.030)
pH: 5 (ref 5.0–8.0)

## 2024-03-15 ENCOUNTER — Ambulatory Visit: Payer: Self-pay | Admitting: Hematology and Oncology

## 2024-04-07 ENCOUNTER — Encounter: Payer: Self-pay | Admitting: Hematology and Oncology

## 2024-05-01 ENCOUNTER — Other Ambulatory Visit: Payer: Self-pay | Admitting: Hematology and Oncology

## 2024-05-05 ENCOUNTER — Ambulatory Visit: Attending: Surgery

## 2024-05-05 VITALS — Wt 220.1 lb

## 2024-05-05 DIAGNOSIS — Z483 Aftercare following surgery for neoplasm: Secondary | ICD-10-CM | POA: Insufficient documentation

## 2024-05-05 NOTE — Patient Instructions (Addendum)
 Victoria Cunningham

## 2024-05-05 NOTE — Therapy (Signed)
 " OUTPATIENT PHYSICAL THERAPY SOZO SCREENING NOTE   Patient Name: Victoria Cunningham MRN: [id number] DOB:1975-12-28, 48 y.o., female Today's Date: 05/05/2024  PCP: [facility] PROVIDER: Vanderbilt Ned, MD   PT End of Session - 05/05/24 1103     Visit Number 2   # unchanged due to screen only   PT Start Time 0950    PT Stop Time 1004    PT Time Calculation (min) 14 min    Activity Tolerance Patient tolerated treatment well    Behavior During Therapy WFL for tasks assessed/performed          Past Medical History:  Diagnosis Date   Cancer (HCC)    breast   Personal history of radiation therapy    PONV (postoperative nausea and vomiting)    Past Surgical History:  Procedure Laterality Date   ABDOMINAL HYSTERECTOMY     BREAST BIOPSY Right 01/05/2023   US  RT BREAST BX W LOC DEV 1ST LESION IMG BX SPEC US  GUIDE 01/05/2023 [facility]   BREAST BIOPSY  02/06/2023   MM RT RADIOACTIVE SEED LOC MAMMO GUIDE 02/06/2023 [facility]MO[facility]   BREAST LUMPECTOMY WITH RADIOACTIVE SEED AND SENTINEL LYMPH NODE BIOPSY Right 02/07/2023   Procedure: RIGHT BREAST SEED LUMPECTOMY, RIGHT SENTINEL LYMPH NODE MAPPING;  Surgeon: Vanderbilt Ned, MD;  Location: [facility];  Service: General;  Laterality: Right;   DIAGNOSTIC LAPAROSCOPY     TOTAL ABDOMINAL HYSTERECTOMY W/ BILATERAL SALPINGOOPHORECTOMY Bilateral 06/06/2023   Dr. 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 She underwent a right lumpectomy and  sentinel node biopsy on 02/07/2023 (3 negative nodes) on 02/07/2023. It is ER/PR positive and HER2 negative with a Ki67 of 5%.   PRECAUTIONS: right UE Lymphedema risk, None  SUBJECTIVE: Pt returns for her 3 month L-Dex screen.   PAIN:  Are you having pain? No  SOZO SCREENING: Patient was assessed today using the SOZO machine to determine the lymphedema index score. This was compared to her baseline score. It was determined that she is NOT within the recommended range when compared to her baseline and so she was fitted for a compression garment while in the clinic today. (Size IV, sand) It is recommended she return in 1 month to be reassessed. If she continues to measure outside the recommended range, physical therapy treatment will be recommended at that time and a referral requested.    L-DEX FLOWSHEETS - 05/05/24 1000       L-DEX LYMPHEDEMA SCREENING   Measurement Type Unilateral    L-DEX MEASUREMENT EXTREMITY Upper Extremity    POSITION  Standing    DOMINANT SIDE Right    At Risk Side Right    BASELINE SCORE (UNILATERAL) -1    L-DEX SCORE (UNILATERAL) 6.1    VALUE CHANGE (UNILAT) 7.1         P: Pt to return in 1 month for L-Dex screen due to high change from baseline.   Aden Berwyn Caldron, PTA 05/05/2024, 11:05 AM    PLEASE KEEP YOUR COMPRESSION GARMENT ON DURING  THE DAY TO GET THE BEST SWELLING REDUCTION. HERE ARE SOME ADDITIONAL TIPS: Do not sleep in your garment. If you have pain or notice swelling in your hand or at the top of your shoulder, call your therapist. This may be a sign that you need a different garment. 3.  Take good care of your garment so it lasts longer: Follow washing instructions on your garment label or box. Wash periodically using a mild detergent in warm water.  Do not use fabric softener or bleach.  Place garment in a mesh lingerie bag and use the gentle cycle of the washing machine or hand wash. Tumble dry low or lay flat to dry. TAKE CARE OF  YOUR SKIN Apply a low pH moisturizing lotion to your skin daily Avoid scratching your skin Treat skin irritations quickly  Know the 5 warning signs of infection: redness, pain, warmth to touch, fever and increased swelling.  Call your physician immediately if you notice any of these signs of a possible infection.   Cancer Rehab [phone]  "

## 2024-05-27 ENCOUNTER — Other Ambulatory Visit

## 2024-05-27 ENCOUNTER — Ambulatory Visit
Admission: RE | Admit: 2024-05-27 | Discharge: 2024-05-27 | Disposition: A | Source: Ambulatory Visit | Attending: Adult Health | Admitting: Adult Health

## 2024-05-27 DIAGNOSIS — Z9189 Other specified personal risk factors, not elsewhere classified: Secondary | ICD-10-CM

## 2024-05-27 DIAGNOSIS — Z170 Estrogen receptor positive status [ER+]: Secondary | ICD-10-CM

## 2024-05-27 MED ORDER — GADOPICLENOL 0.5 MMOL/ML IV SOLN
10.0000 mL | Freq: Once | INTRAVENOUS | Status: AC | PRN
  Administered 2024-05-27: 10 mL via INTRAVENOUS

## 2024-05-28 ENCOUNTER — Ambulatory Visit: Payer: Self-pay

## 2024-06-03 ENCOUNTER — Ambulatory Visit: Attending: Surgery

## 2024-06-03 VITALS — Wt 221.5 lb

## 2024-06-03 DIAGNOSIS — Z483 Aftercare following surgery for neoplasm: Secondary | ICD-10-CM | POA: Insufficient documentation

## 2024-06-03 NOTE — Therapy (Signed)
" °  OUTPATIENT PHYSICAL THERAPY SOZO SCREENING NOTE   Patient Name: Victoria Cunningham MRN: [id number] DOB:06/29/1975, 49 y.o., female Today's Date: 06/03/2024  PCP: [facility] PROVIDER: Vanderbilt Ned, MD   PT End of Session - 06/03/24 1105     Visit Number 2   # unchanged due to screen only   PT Start Time 1101    PT Stop Time 1105    PT Time Calculation (min) 4 min    Activity Tolerance Patient tolerated treatment well    Behavior During Therapy WFL for tasks assessed/performed          Past Medical History:  Diagnosis Date   Cancer (HCC)    breast   Personal history of radiation therapy    PONV (postoperative nausea and vomiting)    Past Surgical History:  Procedure Laterality Date   ABDOMINAL HYSTERECTOMY     BREAST BIOPSY Right 01/05/2023   US  RT BREAST BX W LOC DEV 1ST LESION IMG BX SPEC US  GUIDE 01/05/2023 [facility]   BREAST BIOPSY  02/06/2023   MM RT RADIOACTIVE SEED LOC MAMMO GUIDE 02/06/2023 [facility]   BREAST LUMPECTOMY WITH RADIOACTIVE SEED AND SENTINEL LYMPH NODE BIOPSY Right 02/07/2023   Procedure: RIGHT BREAST SEED LUMPECTOMY, RIGHT SENTINEL LYMPH NODE MAPPING;  Surgeon: Vanderbilt Ned, MD;  Location: [facility];  Service: General;  Laterality: Right;   DIAGNOSTIC LAPAROSCOPY     TOTAL ABDOMINAL HYSTERECTOMY W/ BILATERAL SALPINGOOPHORECTOMY Bilateral 06/06/2023   Dr. 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 She underwent a right lumpectomy and  sentinel node biopsy on 02/07/2023 (3 negative nodes) on 02/07/2023. It is ER/PR positive and HER2 negative with a Ki67 of 5%.   PRECAUTIONS: right UE Lymphedema risk, None  SUBJECTIVE: Pt returns for her 1 month reassess L-Dex screen after having a high change from baseline. I wore my sleeve almost every day!   PAIN:  Are you having pain? No    SOZO SCREENING: Patient was assessed today using the SOZO machine to determine the lymphedema index score. This was compared to her baseline score. It was determined that she is within the recommended range when compared to her baseline and no further action is needed at this time. She will continue SOZO screenings. These are done every 3 months for 2 years post operatively followed by every 6 months for 2 years, and then annually.   L-DEX FLOWSHEETS - 06/03/24 1100       L-DEX LYMPHEDEMA SCREENING   Measurement Type Unilateral    L-DEX MEASUREMENT EXTREMITY Upper Extremity    POSITION  Standing    DOMINANT SIDE Right    At Risk Side Right    BASELINE SCORE (UNILATERAL) -1    L-DEX SCORE (UNILATERAL) -4    VALUE CHANGE (UNILAT) -3          P: Resume every 3 month L-Dex screens until 01/2025, then can transition to 6 months until 01/2027.    Berwyn Knights, PTA 06/03/24 11:06 AM   "

## 2024-09-08 ENCOUNTER — Ambulatory Visit

## 2025-03-11 ENCOUNTER — Inpatient Hospital Stay: Admitting: Hematology and Oncology
